# Patient Record
Sex: Male | Born: 1975
Health system: Southern US, Community
[De-identification: ages and names within clinical notes are randomized; demographics above are authoritative.]

## PROBLEM LIST (undated history)

## (undated) DIAGNOSIS — M109 Gout, unspecified: Secondary | ICD-10-CM

## (undated) DIAGNOSIS — R7612 Nonspecific reaction to cell mediated immunity measurement of gamma interferon antigen response without active tuberculosis: Secondary | ICD-10-CM

## (undated) DIAGNOSIS — K430 Incisional hernia with obstruction, without gangrene: Secondary | ICD-10-CM

## (undated) DIAGNOSIS — M7710 Lateral epicondylitis, unspecified elbow: Secondary | ICD-10-CM

## (undated) DIAGNOSIS — E119 Type 2 diabetes mellitus without complications: Secondary | ICD-10-CM

## (undated) DIAGNOSIS — R51 Headache: Secondary | ICD-10-CM

## (undated) DIAGNOSIS — K219 Gastro-esophageal reflux disease without esophagitis: Secondary | ICD-10-CM

## (undated) DIAGNOSIS — M25519 Pain in unspecified shoulder: Secondary | ICD-10-CM

## (undated) HISTORY — PX: TONSILLECTOMY: SUR1361

## (undated) HISTORY — DX: Nonspecific reaction to cell mediated immunity measurement of gamma interferon antigen response without active tuberculosis: R76.12

## (undated) HISTORY — DX: Pain in unspecified shoulder: M25.519

## (undated) HISTORY — DX: Headache: R51

## (undated) HISTORY — DX: Gastro-esophageal reflux disease without esophagitis: K21.9

## (undated) HISTORY — DX: Gout, unspecified: M10.9

## (undated) HISTORY — DX: Type 2 diabetes mellitus without complications: E11.9

## (undated) HISTORY — DX: Lateral epicondylitis, unspecified elbow: M77.10

---
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
## 1998-03-09 ENCOUNTER — Other Ambulatory Visit: Admission: RE | Admit: 1998-03-09 | Discharge: 1998-03-09 | Payer: Self-pay | Admitting: Urology

## 1998-05-11 ENCOUNTER — Encounter: Payer: Self-pay | Admitting: *Deleted

## 1998-05-11 ENCOUNTER — Ambulatory Visit (HOSPITAL_COMMUNITY): Admission: RE | Admit: 1998-05-11 | Discharge: 1998-05-11 | Payer: Self-pay | Admitting: Family Medicine

## 2001-06-21 ENCOUNTER — Ambulatory Visit (HOSPITAL_COMMUNITY): Admission: RE | Admit: 2001-06-21 | Discharge: 2001-06-21 | Payer: Self-pay | Admitting: Family Medicine

## 2001-06-21 ENCOUNTER — Encounter: Payer: Self-pay | Admitting: Family Medicine

## 2001-11-23 ENCOUNTER — Ambulatory Visit (HOSPITAL_COMMUNITY): Admission: RE | Admit: 2001-11-23 | Discharge: 2001-11-23 | Payer: Self-pay | Admitting: Family Medicine

## 2001-11-23 ENCOUNTER — Encounter: Payer: Self-pay | Admitting: Family Medicine

## 2004-06-07 ENCOUNTER — Emergency Department (HOSPITAL_COMMUNITY): Admission: EM | Admit: 2004-06-07 | Discharge: 2004-06-07 | Payer: Self-pay | Admitting: Emergency Medicine

## 2005-08-11 ENCOUNTER — Ambulatory Visit (HOSPITAL_COMMUNITY): Admission: RE | Admit: 2005-08-11 | Discharge: 2005-08-11 | Payer: Self-pay | Admitting: Family Medicine

## 2009-01-09 ENCOUNTER — Encounter: Payer: Self-pay | Admitting: Family Medicine

## 2009-01-10 ENCOUNTER — Ambulatory Visit: Payer: Self-pay | Admitting: Family Medicine

## 2009-01-10 DIAGNOSIS — R519 Headache, unspecified: Secondary | ICD-10-CM | POA: Insufficient documentation

## 2009-01-10 DIAGNOSIS — R51 Headache: Secondary | ICD-10-CM | POA: Insufficient documentation

## 2009-01-10 DIAGNOSIS — R079 Chest pain, unspecified: Secondary | ICD-10-CM | POA: Insufficient documentation

## 2009-01-10 HISTORY — DX: Headache: R51

## 2009-01-11 LAB — CONVERTED CEMR LAB
ALT: 31 units/L (ref 0–53)
AST: 22 units/L (ref 0–37)
Albumin: 4 g/dL (ref 3.5–5.2)
Alkaline Phosphatase: 77 units/L (ref 39–117)
BUN: 14 mg/dL (ref 6–23)
Basophils Absolute: 0.1 10*3/uL (ref 0.0–0.1)
Basophils Relative: 0.7 % (ref 0.0–3.0)
Bilirubin, Direct: 0 mg/dL (ref 0.0–0.3)
CO2: 29 meq/L (ref 19–32)
Calcium: 9.3 mg/dL (ref 8.4–10.5)
Chloride: 108 meq/L (ref 96–112)
Cholesterol: 222 mg/dL — ABNORMAL HIGH (ref 0–200)
Creatinine, Ser: 1 mg/dL (ref 0.4–1.5)
Direct LDL: 160.2 mg/dL
Eosinophils Absolute: 0.2 10*3/uL (ref 0.0–0.7)
Eosinophils Relative: 2.2 % (ref 0.0–5.0)
GFR calc non Af Amer: 91.12 mL/min (ref >60)
Glucose, Bld: 105 mg/dL — ABNORMAL HIGH (ref 70–99)
HCT: 46.8 % (ref 39.0–52.0)
HDL: 38.2 mg/dL — ABNORMAL LOW (ref >39.00)
Hemoglobin: 16.3 g/dL (ref 13.0–17.0)
Lymphocytes Relative: 20.4 % (ref 12.0–46.0)
Lymphs Abs: 1.6 10*3/uL (ref 0.7–4.0)
MCHC: 34.8 g/dL (ref 30.0–36.0)
MCV: 90.2 fL (ref 78.0–100.0)
Monocytes Absolute: 0.5 10*3/uL (ref 0.1–1.0)
Monocytes Relative: 5.7 % (ref 3.0–12.0)
Neutro Abs: 5.5 10*3/uL (ref 1.4–7.7)
Neutrophils Relative %: 71 % (ref 43.0–77.0)
Platelets: 192 10*3/uL (ref 150.0–400.0)
Potassium: 4.4 meq/L (ref 3.5–5.1)
RBC: 5.18 M/uL (ref 4.22–5.81)
RDW: 13 % (ref 11.5–14.6)
Sodium: 142 meq/L (ref 135–145)
TSH: 2.03 microintl units/mL (ref 0.35–5.50)
Total Bilirubin: 1 mg/dL (ref 0.3–1.2)
Total CHOL/HDL Ratio: 6
Total Protein: 6.7 g/dL (ref 6.0–8.3)
Triglycerides: 165 mg/dL — ABNORMAL HIGH (ref 0.0–149.0)
VLDL: 33 mg/dL (ref 0.0–40.0)
WBC: 7.9 10*3/uL (ref 4.5–10.5)

## 2009-02-19 ENCOUNTER — Encounter (INDEPENDENT_AMBULATORY_CARE_PROVIDER_SITE_OTHER): Payer: Self-pay | Admitting: *Deleted

## 2009-08-03 ENCOUNTER — Ambulatory Visit: Payer: Self-pay | Admitting: Family Medicine

## 2009-08-03 DIAGNOSIS — M7710 Lateral epicondylitis, unspecified elbow: Secondary | ICD-10-CM | POA: Insufficient documentation

## 2009-08-03 DIAGNOSIS — B9789 Other viral agents as the cause of diseases classified elsewhere: Secondary | ICD-10-CM | POA: Insufficient documentation

## 2009-08-03 HISTORY — DX: Lateral epicondylitis, unspecified elbow: M77.10

## 2009-12-25 ENCOUNTER — Emergency Department (HOSPITAL_COMMUNITY): Admission: EM | Admit: 2009-12-25 | Discharge: 2009-12-25 | Payer: Self-pay | Admitting: Emergency Medicine

## 2010-01-23 ENCOUNTER — Ambulatory Visit: Payer: Self-pay | Admitting: Family Medicine

## 2010-01-23 DIAGNOSIS — M25519 Pain in unspecified shoulder: Secondary | ICD-10-CM | POA: Insufficient documentation

## 2010-01-23 HISTORY — DX: Pain in unspecified shoulder: M25.519

## 2010-01-24 ENCOUNTER — Encounter (INDEPENDENT_AMBULATORY_CARE_PROVIDER_SITE_OTHER): Payer: Self-pay | Admitting: *Deleted

## 2010-03-21 DIAGNOSIS — K2990 Gastroduodenitis, unspecified, without bleeding: Secondary | ICD-10-CM

## 2010-03-21 DIAGNOSIS — K2970 Gastritis, unspecified, without bleeding: Secondary | ICD-10-CM | POA: Insufficient documentation

## 2010-03-21 DIAGNOSIS — K219 Gastro-esophageal reflux disease without esophagitis: Secondary | ICD-10-CM | POA: Insufficient documentation

## 2010-03-21 HISTORY — DX: Gastro-esophageal reflux disease without esophagitis: K21.9

## 2010-07-23 NOTE — Letter (Signed)
Summary: New Patient letter  [facility]  [address] [place], [place] [address]   Phone: [phone]  Fax: [phone]       01/24/2010 MRN: [id number]  Jonathan Vaughn [address] [place], [place]  [address]  Dear Jonathan Vaughn,  Welcome to the Gastroenterology Division at [facility].    You are scheduled to see Dr. Juanda Chance on 03/29/2010 at 10:30AM on the 3rd floor at [facility], [address]  We ask that you try to arrive at our office 15 minutes prior to your appointment time to allow for check-in.  We would like you to complete the enclosed self-administered evaluation form prior to your visit and bring it with you on the day of your appointment.  We will review it with you.  Also, please bring a complete list of all your medications or, if you prefer, bring the medication bottles and we will list them.  Please bring your insurance card so that we may make a copy of it.  If your insurance requires a referral to see a specialist, please bring your referral form from your primary care physician.  Co-payments are due at the time of your visit and may be paid by cash, check or credit card.     Your office visit will consist of a consult with your physician (includes a physical exam), any laboratory testing he/she may order, scheduling of any necessary diagnostic testing (e.g. x-ray, ultrasound, CT-scan), and scheduling of a procedure (e.g. Endoscopy, Colonoscopy) if required.  Please allow enough time on your schedule to allow for any/all of these possibilities.    If you cannot keep your appointment, please call [phone] to cancel or reschedule prior to your appointment date.  This allows [place] the opportunity to schedule an appointment for another patient in need of care.  If you do not cancel or reschedule by 5 p.m. the business day prior to your appointment date, you will be charged a $50.00 late cancellation/no-show fee.    Thank you for choosing  [facility] for your medical needs.  We appreciate the opportunity to care for you.  Please visit [place] at our website  to learn more about our practice.                     Sincerely,                                                             The Gastroenterology Division
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
## 2010-07-23 NOTE — Assessment & Plan Note (Signed)
Summary: elbow pain//ccm   Vital Signs:  Patient profile:   35 year old male Temp:     98.7 degrees F oral BP sitting:   110 / 80  (left arm) Cuff size:   regular  Vitals Entered By: Sid Falcon LPN (August 03, 2009 8:35 AM) CC: right elbow pain X 6 months   History of Present Illness: Acute visit. Patient seen right elbow pain almost 6 months. Works as a Music therapist. Increasing pain with gripping.  Achy quality.  Pain is right lateral elbow. No significant radiation. Exacerbated by gripping. No weakness. No injury. over-the-counter medications have not relieved. Has not tried any icing.  Pt has seperate issue.   Complaining of a one-day history of possible low-grade fever, body aches, and nasal congestion. Denies any cough or sore throat.  Allergies: 1)  Zithromax Z-Pak (Azithromycin)  Past History:  Past Medical History: Last updated: 02/14/2009 Current Problems:  CHEST PAIN UNSPECIFIED (ICD-786.50) ROUTINE GENERAL MEDICAL EXAM@HEALTH  CARE FACL (ICD-V70.0) HEADACHE (ICD-784.0) Headache Blood in stool PMH reviewed for relevance  Review of Systems      See HPI  Physical Exam  General:  Well-developed,well-nourished,in no acute distress; alert,appropriate and cooperative throughout examination Eyes:  No corneal or conjunctival inflammation noted. EOMI. Perrla. Funduscopic exam benign, without hemorrhages, exudates or papilledema. Vision grossly normal. Ears:  External ear exam shows no significant lesions or deformities.  Otoscopic examination reveals clear canals, tympanic membranes are intact bilaterally without bulging, retraction, inflammation or discharge. Hearing is grossly normal bilaterally. Mouth:  Oral mucosa and oropharynx without lesions or exudates.  Teeth in good repair. Neck:  No deformities, masses, or tenderness noted. Lungs:  Normal respiratory effort, chest expands symmetrically. Lungs are clear to auscultation, no crackles or wheezes. Heart:  Normal  rate and regular rhythm. S1 and S2 normal without gallop, murmur, click, rub or other extra sounds. Extremities:  Tender right lateral epicondylar region. Pain with extension of wrist against resistance. Good range of motion elbow.   Impression & Recommendations:  Problem # 1:  LATERAL EPICONDYLITIS OF ELBOW (ICD-726.32)  given duration of symptoms recommended consideration of corticosteroid injection. Reviewed risk and benefits and pt consented to right elbow injection with 40 mg Depo-Medrol and 1 cc plain Xylocaine. Consider tennis elbow strap. Recommend icing several times daily  Orders: Injection, Tendon / Ligament ([address]) Depo- Medrol 40mg  (J1030)  Problem # 2:  VIRAL INFECTION (ICD-079.99) treat symptomatically  Complete Medication List: 1)  Chantix Starting Month Pak 0.5 Mg X 11 & 1 Mg X 42 Tabs (Varenicline tartrate) .... Use as directed 2)  Chantix Continuing Month Pak 1 Mg Tabs (Varenicline tartrate) .... Use as directed  Patient Instructions: 1)  Consider tennis elbow strap 2)  Consider icing right elbow 2-3 times daily for 20-30 minutes 3)  Avoid heavy gripping with the right upper extremity is much as possible

## 2010-12-27 ENCOUNTER — Emergency Department (HOSPITAL_COMMUNITY): Payer: Self-pay

## 2010-12-27 ENCOUNTER — Emergency Department (HOSPITAL_COMMUNITY)
Admission: EM | Admit: 2010-12-27 | Discharge: 2010-12-27 | Disposition: A | Discharge status: Home / Self Care | Payer: Self-pay | Attending: Emergency Medicine | Admitting: Emergency Medicine

## 2010-12-27 DIAGNOSIS — M549 Dorsalgia, unspecified: Secondary | ICD-10-CM | POA: Insufficient documentation

## 2010-12-27 DIAGNOSIS — R112 Nausea with vomiting, unspecified: Secondary | ICD-10-CM | POA: Insufficient documentation

## 2010-12-27 DIAGNOSIS — R109 Unspecified abdominal pain: Secondary | ICD-10-CM | POA: Insufficient documentation

## 2010-12-27 LAB — BASIC METABOLIC PANEL
BUN: 16 mg/dL (ref 6–23)
Chloride: 103 mEq/L (ref 96–112)
Creatinine, Ser: 1.09 mg/dL (ref 0.50–1.35)
GFR calc Af Amer: >60 mL/min (ref >60)
GFR calc non Af Amer: >60 mL/min (ref >60)

## 2010-12-27 LAB — CBC
HCT: 45.1 % (ref 39.0–52.0)
MCH: 31.1 pg (ref 26.0–34.0)
MCV: 89.3 fL (ref 78.0–100.0)
RDW: 13.2 % (ref 11.5–15.5)
WBC: 10 10*3/uL (ref 4.0–10.5)

## 2010-12-27 LAB — DIFFERENTIAL
Eosinophils Relative: 2 % (ref 0–5)
Lymphocytes Relative: 18 % (ref 12–46)
Lymphs Abs: 1.8 10*3/uL (ref 0.7–4.0)
Monocytes Relative: 4 % (ref 3–12)

## 2010-12-27 LAB — URINALYSIS, ROUTINE W REFLEX MICROSCOPIC
Bilirubin Urine: NEGATIVE
Glucose, UA: NEGATIVE mg/dL
Hgb urine dipstick: NEGATIVE
Ketones, ur: NEGATIVE mg/dL
Leukocytes, UA: NEGATIVE
Protein, ur: NEGATIVE mg/dL
pH: 6 (ref 5.0–8.0)

## 2011-09-03 ENCOUNTER — Ambulatory Visit: Payer: Self-pay | Admitting: Family Medicine

## 2011-09-04 ENCOUNTER — Ambulatory Visit: Payer: Self-pay | Admitting: Family Medicine

## 2011-09-05 ENCOUNTER — Encounter: Payer: Self-pay | Admitting: Family Medicine

## 2011-09-05 DIAGNOSIS — Z0289 Encounter for other administrative examinations: Secondary | ICD-10-CM

## 2011-09-07 NOTE — Progress Notes (Signed)
This encounter was created in error - please disregard.

## 2012-01-14 ENCOUNTER — Encounter (HOSPITAL_COMMUNITY): Payer: Self-pay | Admitting: *Deleted

## 2012-01-14 ENCOUNTER — Emergency Department (HOSPITAL_COMMUNITY): Payer: BC Managed Care – PPO

## 2012-01-14 ENCOUNTER — Emergency Department (HOSPITAL_COMMUNITY)
Admission: EM | Admit: 2012-01-14 | Discharge: 2012-01-14 | Disposition: A | Discharge status: Home / Self Care | Payer: BC Managed Care – PPO | Attending: Emergency Medicine | Admitting: Emergency Medicine

## 2012-01-14 DIAGNOSIS — F17200 Nicotine dependence, unspecified, uncomplicated: Secondary | ICD-10-CM | POA: Insufficient documentation

## 2012-01-14 DIAGNOSIS — R079 Chest pain, unspecified: Secondary | ICD-10-CM | POA: Insufficient documentation

## 2012-01-14 DIAGNOSIS — K219 Gastro-esophageal reflux disease without esophagitis: Secondary | ICD-10-CM | POA: Insufficient documentation

## 2012-01-14 LAB — BASIC METABOLIC PANEL
Calcium: 10.1 mg/dL (ref 8.4–10.5)
Chloride: 102 mEq/L (ref 96–112)
Creatinine, Ser: 0.98 mg/dL (ref 0.50–1.35)
GFR calc Af Amer: >90 mL/min (ref >90)
GFR calc non Af Amer: >90 mL/min (ref >90)

## 2012-01-14 LAB — CBC WITH DIFFERENTIAL/PLATELET
Basophils Absolute: 0 10*3/uL (ref 0.0–0.1)
Basophils Relative: 0 % (ref 0–1)
HCT: 46.5 % (ref 39.0–52.0)
MCHC: 35.3 g/dL (ref 30.0–36.0)
Monocytes Absolute: 0.5 10*3/uL (ref 0.1–1.0)
Neutro Abs: 10.4 10*3/uL — ABNORMAL HIGH (ref 1.7–7.7)
RDW: 13.3 % (ref 11.5–15.5)

## 2012-01-14 LAB — TROPONIN I: Troponin I: <0.3 ng/mL (ref <0.30)

## 2012-01-14 MED ORDER — SODIUM CHLORIDE 0.9 % IV SOLN
Freq: Once | INTRAVENOUS | Status: AC
  Administered 2012-01-14: 999 mL via INTRAVENOUS

## 2012-01-14 MED ORDER — FAMOTIDINE IN NACL 20-0.9 MG/50ML-% IV SOLN
20.0000 mg | Freq: Once | INTRAVENOUS | Status: AC
  Administered 2012-01-14: 20 mg via INTRAVENOUS
  Filled 2012-01-14: qty 50

## 2012-01-14 NOTE — ED Notes (Signed)
Pt complains of substernal chest pain relieved with spliniting onset 3 hours ago, hx of similar episode 1 week ago that subsided, pt does have recent stress at work, he is self employed.

## 2012-01-14 NOTE — ED Provider Notes (Signed)
Medical screening examination/treatment/procedure(s) were performed by non-physician practitioner and as supervising physician I was immediately available for consultation/collaboration.   Aubreyanna Dorrough B. Bernette Mayers, MD 01/14/12 1526
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
## 2012-01-14 NOTE — ED Notes (Signed)
Went to assess pt and pt sts he is on the phone for now.

## 2012-01-14 NOTE — ED Notes (Signed)
To ED for eval of midsternal cp with radiation to left arm. Pt emotional. Denies vomiting. States he has felt this before but not evaluated.

## 2013-12-19 ENCOUNTER — Encounter (HOSPITAL_COMMUNITY): Payer: Self-pay | Admitting: Emergency Medicine

## 2013-12-19 ENCOUNTER — Emergency Department (HOSPITAL_COMMUNITY)
Admission: EM | Admit: 2013-12-19 | Discharge: 2013-12-19 | Disposition: A | Discharge status: Home / Self Care | Payer: BC Managed Care – PPO | Attending: Emergency Medicine | Admitting: Emergency Medicine

## 2013-12-19 ENCOUNTER — Emergency Department (HOSPITAL_COMMUNITY): Payer: BC Managed Care – PPO

## 2013-12-19 DIAGNOSIS — F17200 Nicotine dependence, unspecified, uncomplicated: Secondary | ICD-10-CM | POA: Insufficient documentation

## 2013-12-19 DIAGNOSIS — Z8719 Personal history of other diseases of the digestive system: Secondary | ICD-10-CM | POA: Insufficient documentation

## 2013-12-19 DIAGNOSIS — Z8619 Personal history of other infectious and parasitic diseases: Secondary | ICD-10-CM | POA: Insufficient documentation

## 2013-12-19 DIAGNOSIS — Z8739 Personal history of other diseases of the musculoskeletal system and connective tissue: Secondary | ICD-10-CM | POA: Insufficient documentation

## 2013-12-19 DIAGNOSIS — R079 Chest pain, unspecified: Secondary | ICD-10-CM

## 2013-12-19 LAB — BASIC METABOLIC PANEL
BUN: 13 mg/dL (ref 6–23)
CHLORIDE: 99 meq/L (ref 96–112)
CO2: 24 mEq/L (ref 19–32)
Calcium: 9.3 mg/dL (ref 8.4–10.5)
Creatinine, Ser: 0.95 mg/dL (ref 0.50–1.35)
Glucose, Bld: 91 mg/dL (ref 70–99)
POTASSIUM: 4.1 meq/L (ref 3.7–5.3)
SODIUM: 141 meq/L (ref 137–147)

## 2013-12-19 LAB — CBC
HEMATOCRIT: 47.1 % (ref 39.0–52.0)
HEMOGLOBIN: 16.2 g/dL (ref 13.0–17.0)
MCH: 31.3 pg (ref 26.0–34.0)
MCHC: 34.4 g/dL (ref 30.0–36.0)
MCV: 91.1 fL (ref 78.0–100.0)
Platelets: 193 10*3/uL (ref 150–400)
RBC: 5.17 MIL/uL (ref 4.22–5.81)
RDW: 13.7 % (ref 11.5–15.5)
WBC: 13.1 10*3/uL — AB (ref 4.0–10.5)

## 2013-12-19 LAB — D-DIMER, QUANTITATIVE (NOT AT ARMC)

## 2013-12-19 LAB — I-STAT TROPONIN, ED
TROPONIN I, POC: 0.01 ng/mL (ref 0.00–0.08)
TROPONIN I, POC: 0.02 ng/mL (ref 0.00–0.08)

## 2013-12-19 MED ORDER — ACETAMINOPHEN 325 MG PO TABS
650.0000 mg | ORAL_TABLET | Freq: Once | ORAL | Status: AC
  Administered 2013-12-19: 650 mg via ORAL
  Filled 2013-12-19: qty 2

## 2013-12-19 MED ORDER — NITROGLYCERIN 0.4 MG SL SUBL: 0.4000 mg | SUBLINGUAL_TABLET | SUBLINGUAL | Status: DC | PRN
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
## 2013-12-19 NOTE — ED Notes (Signed)
[facility] for complaint of chest pain starting at approximately 0900 this morning.  Patient was given 324 mg aspirin at the Urgent Care.  EMS gave 1 sublingual nitro.  Patient initially rating pain at an 8-9.  Now rates at a 2 after nitro.

## 2013-12-20 NOTE — ED Provider Notes (Signed)
I saw and evaluated the patient, reviewed the resident's note and I agree with the findings and plan.   EKG Interpretation   Date/Time:  Monday December 19 2013 11:18:39 EDT Ventricular Rate:  99 PR Interval:  134 QRS Duration: 105 QT Interval:  332 QTC Calculation: 426 R Axis:   6 Text Interpretation:  Sinus rhythm No significant change since last  tracing Confirmed by STEINL  MD, Caryn BeeKEVIN ([id number]) on 12/19/2013 11:22:21 AM      Pt with chest tightness since morning. Constant. At rest. No nv, diaphoresis or sob. No palpitations. No syncope. Denies any exertional cp or discomfort. No unusual doe or fatigue.  Will check troponin x 2, chest xr.   Suzi RootsKevin E Steinl, MD 12/20/13 [phone]

## 2016-06-30 DIAGNOSIS — M545 Low back pain: Secondary | ICD-10-CM | POA: Diagnosis not present

## 2016-06-30 DIAGNOSIS — R351 Nocturia: Secondary | ICD-10-CM | POA: Diagnosis not present

## 2016-08-21 DIAGNOSIS — M109 Gout, unspecified: Secondary | ICD-10-CM | POA: Diagnosis not present

## 2016-09-02 ENCOUNTER — Observation Stay (HOSPITAL_COMMUNITY)
Admission: EM | Admit: 2016-09-02 | Discharge: 2016-09-04 | Disposition: A | Discharge status: Home / Self Care | Payer: BLUE CROSS/BLUE SHIELD | Attending: General Surgery | Admitting: General Surgery

## 2016-09-02 ENCOUNTER — Encounter (HOSPITAL_COMMUNITY): Payer: Self-pay

## 2016-09-02 ENCOUNTER — Observation Stay (HOSPITAL_COMMUNITY): Payer: BLUE CROSS/BLUE SHIELD

## 2016-09-02 ENCOUNTER — Emergency Department (HOSPITAL_COMMUNITY): Payer: BLUE CROSS/BLUE SHIELD

## 2016-09-02 DIAGNOSIS — Z8379 Family history of other diseases of the digestive system: Secondary | ICD-10-CM | POA: Diagnosis not present

## 2016-09-02 DIAGNOSIS — Z881 Allergy status to other antibiotic agents status: Secondary | ICD-10-CM | POA: Insufficient documentation

## 2016-09-02 DIAGNOSIS — R079 Chest pain, unspecified: Secondary | ICD-10-CM | POA: Diagnosis not present

## 2016-09-02 DIAGNOSIS — K2970 Gastritis, unspecified, without bleeding: Secondary | ICD-10-CM | POA: Insufficient documentation

## 2016-09-02 DIAGNOSIS — R1011 Right upper quadrant pain: Secondary | ICD-10-CM

## 2016-09-02 DIAGNOSIS — K8020 Calculus of gallbladder without cholecystitis without obstruction: Secondary | ICD-10-CM

## 2016-09-02 DIAGNOSIS — K219 Gastro-esophageal reflux disease without esophagitis: Secondary | ICD-10-CM | POA: Insufficient documentation

## 2016-09-02 DIAGNOSIS — F17210 Nicotine dependence, cigarettes, uncomplicated: Secondary | ICD-10-CM | POA: Insufficient documentation

## 2016-09-02 DIAGNOSIS — F419 Anxiety disorder, unspecified: Secondary | ICD-10-CM | POA: Insufficient documentation

## 2016-09-02 DIAGNOSIS — K8010 Calculus of gallbladder with chronic cholecystitis without obstruction: Principal | ICD-10-CM | POA: Insufficient documentation

## 2016-09-02 DIAGNOSIS — D72829 Elevated white blood cell count, unspecified: Secondary | ICD-10-CM | POA: Diagnosis not present

## 2016-09-02 DIAGNOSIS — R109 Unspecified abdominal pain: Secondary | ICD-10-CM

## 2016-09-02 DIAGNOSIS — K8000 Calculus of gallbladder with acute cholecystitis without obstruction: Secondary | ICD-10-CM | POA: Diagnosis present

## 2016-09-02 DIAGNOSIS — M1990 Unspecified osteoarthritis, unspecified site: Secondary | ICD-10-CM | POA: Diagnosis not present

## 2016-09-02 DIAGNOSIS — K760 Fatty (change of) liver, not elsewhere classified: Secondary | ICD-10-CM | POA: Diagnosis not present

## 2016-09-02 LAB — CREATININE, SERUM
Creatinine, Ser: 0.76 mg/dL (ref 0.61–1.24)
GFR calc Af Amer: >60 mL/min (ref >60)

## 2016-09-02 LAB — COMPREHENSIVE METABOLIC PANEL
ALT: 45 U/L (ref 17–63)
AST: 24 U/L (ref 15–41)
Albumin: 4.3 g/dL (ref 3.5–5.0)
Alkaline Phosphatase: 78 U/L (ref 38–126)
Anion gap: 13 (ref 5–15)
BILIRUBIN TOTAL: 0.4 mg/dL (ref 0.3–1.2)
BUN: 12 mg/dL (ref 6–20)
CO2: 21 mmol/L — ABNORMAL LOW (ref 22–32)
CREATININE: 0.99 mg/dL (ref 0.61–1.24)
Calcium: 9.6 mg/dL (ref 8.9–10.3)
Chloride: 105 mmol/L (ref 101–111)
Glucose, Bld: 135 mg/dL — ABNORMAL HIGH (ref 65–99)
Potassium: 4.2 mmol/L (ref 3.5–5.1)
Sodium: 139 mmol/L (ref 135–145)
Total Protein: 6.6 g/dL (ref 6.5–8.1)

## 2016-09-02 LAB — CBC
HEMATOCRIT: 45 % (ref 39.0–52.0)
HEMATOCRIT: 45.2 % (ref 39.0–52.0)
HEMOGLOBIN: 15.5 g/dL (ref 13.0–17.0)
HEMOGLOBIN: 15.3 g/dL (ref 13.0–17.0)
MCH: 30.6 pg (ref 26.0–34.0)
MCH: 30.2 pg (ref 26.0–34.0)
MCHC: 34.4 g/dL (ref 30.0–36.0)
MCHC: 33.8 g/dL (ref 30.0–36.0)
MCV: 88.9 fL (ref 78.0–100.0)
MCV: 89.3 fL (ref 78.0–100.0)
Platelets: 237 10*3/uL (ref 150–400)
Platelets: 250 10*3/uL (ref 150–400)
RBC: 5.06 MIL/uL (ref 4.22–5.81)
RBC: 5.06 MIL/uL (ref 4.22–5.81)
RDW: 13.5 % (ref 11.5–15.5)
RDW: 13.6 % (ref 11.5–15.5)
WBC: 17.7 10*3/uL — ABNORMAL HIGH (ref 4.0–10.5)
WBC: 16 10*3/uL — ABNORMAL HIGH (ref 4.0–10.5)

## 2016-09-02 LAB — I-STAT TROPONIN, ED: TROPONIN I, POC: 0 ng/mL (ref 0.00–0.08)

## 2016-09-02 LAB — LIPASE, BLOOD

## 2016-09-02 MED ORDER — PANTOPRAZOLE SODIUM 40 MG IV SOLR
40.0000 mg | Freq: Two times a day (BID) | INTRAVENOUS | Status: DC
  Administered 2016-09-02 – 2016-09-03 (×3): 40 mg via INTRAVENOUS
  Filled 2016-09-02 (×3): qty 40

## 2016-09-02 MED ORDER — ONDANSETRON HCL 4 MG/2ML IJ SOLN
4.0000 mg | Freq: Four times a day (QID) | INTRAMUSCULAR | Status: DC | PRN
Start: 2016-09-02 — End: 2016-09-04
  Administered 2016-09-02 – 2016-09-03 (×2): 4 mg via INTRAVENOUS
  Filled 2016-09-02: qty 2

## 2016-09-02 MED ORDER — SODIUM CHLORIDE 0.9 % IV BOLUS (SEPSIS)
1000.0000 mL | Freq: Once | INTRAVENOUS | Status: AC
  Administered 2016-09-02: 1000 mL via INTRAVENOUS

## 2016-09-02 MED ORDER — DEXTROSE 5 % IV SOLN
2.0000 g | INTRAVENOUS | Status: DC
  Administered 2016-09-02 – 2016-09-03 (×2): 2 g via INTRAVENOUS
  Filled 2016-09-02 (×3): qty 2

## 2016-09-02 MED ORDER — HYDROMORPHONE HCL 2 MG/ML IJ SOLN
1.0000 mg | Freq: Once | INTRAMUSCULAR | Status: AC
  Administered 2016-09-02: 1 mg via INTRAVENOUS
  Filled 2016-09-02: qty 1

## 2016-09-02 MED ORDER — HEPARIN SODIUM (PORCINE) 5000 UNIT/ML IJ SOLN: 5000.0000 [IU] | Freq: Three times a day (TID) | INTRAMUSCULAR | Status: DC

## 2016-09-02 MED ORDER — ONDANSETRON HCL 4 MG/2ML IJ SOLN
4.0000 mg | Freq: Once | INTRAMUSCULAR | Status: AC
  Administered 2016-09-02: 4 mg via INTRAVENOUS
  Filled 2016-09-02: qty 2

## 2016-09-02 MED ORDER — IOPAMIDOL (ISOVUE-300) INJECTION 61%
INTRAVENOUS | Status: AC
Start: 2016-09-02 — End: 2016-09-02
  Administered 2016-09-02: 100 mL
  Filled 2016-09-02: qty 100

## 2016-09-02 MED ORDER — GI COCKTAIL ~~LOC~~
30.0000 mL | Freq: Once | ORAL | Status: AC
  Administered 2016-09-02: 30 mL via ORAL
  Filled 2016-09-02: qty 30

## 2016-09-02 MED ORDER — ONDANSETRON 4 MG PO TBDP: 4.0000 mg | ORAL_TABLET | Freq: Four times a day (QID) | ORAL | Status: DC | PRN

## 2016-09-02 MED ORDER — POTASSIUM CHLORIDE IN NACL 20-0.9 MEQ/L-% IV SOLN
INTRAVENOUS | Status: DC
  Administered 2016-09-02 – 2016-09-03 (×3): via INTRAVENOUS
  Filled 2016-09-02 (×3): qty 1000

## 2016-09-02 MED ORDER — HYDROMORPHONE HCL 2 MG/ML IJ SOLN
0.5000 mg | Freq: Once | INTRAMUSCULAR | Status: AC
  Administered 2016-09-02: 0.5 mg via INTRAVENOUS
  Filled 2016-09-02: qty 1

## 2016-09-02 MED ORDER — MORPHINE SULFATE (PF) 4 MG/ML IV SOLN
2.0000 mg | INTRAVENOUS | Status: DC | PRN
  Administered 2016-09-02 – 2016-09-04 (×7): 4 mg via INTRAVENOUS
  Filled 2016-09-02 (×8): qty 1

## 2016-09-02 NOTE — ED Notes (Signed)
ED Provider at bedside. 

## 2016-09-02 NOTE — ED Notes (Signed)
Patient transported to Ultrasound 

## 2016-09-02 NOTE — ED Provider Notes (Signed)
[facility]-EMERGENCY DEPT Provider Note   CSN: [id number] Arrival date & time: 09/02/16  1324     History   Chief Complaint Chief Complaint  Patient presents with  . Chest Pain    HPI Jonathan Vaughn is a 41 y.o. male.  Patient is a 41 year old male with a history of GERD, gastritis and occasional headache who has a history of tobacco abuse and alcohol use presenting today with abrupt onset of abdominal pain. Patient states when he went to bed last night he felt fine. Around midnight he woke up with severe right upper quadrant and epigastric discomfort that radiated into the chest. It has persisted only worsened throughout the night and is still not gone. He has tried to take Tums and over-the-counter indigestion medication without improvement. He has had several episodes of nonbloody vomiting and continues to be nauseated. He denies true shortness of breath but states taking a deep breath makes the pain worse. He has not had any bowel movements since the pain started but has been able to urinate fine. He denies any cough or upper respiratory symptoms. He has never had a pain like this before.   The history is provided by the patient.  Chest Pain   The pain is at a severity of 9/10. The pain is severe. The quality of the pain is described as sharp and stabbing. The pain radiates to the epigastrium and precordial region. Associated symptoms include nausea and vomiting. Pertinent negatives include no cough, no fever, no irregular heartbeat, no lower extremity edema, no shortness of breath, no sputum production and no syncope.    Past Medical History:  Diagnosis Date  . GASTRITIS 03/21/2010   Qualifier: Diagnosis of  By: Nelson-Smith CMA (AAMA), Dottie    . GERD 03/21/2010   Qualifier: Diagnosis of  By: Nelson-Smith CMA (AAMA), Dottie    . Headache(784.0) 01/10/2009   Qualifier: Diagnosis of  By: Gabriel Rung LPN, Harriett Sine    . LATERAL EPICONDYLITIS OF ELBOW 08/03/2009   Qualifier: Diagnosis of  By:  Caryl Never MD, Bruce    . SHOULDER PAIN 01/23/2010   Qualifier: Diagnosis of  By: Gabriel Rung LPN, Harriett Sine    . VIRAL INFECTION 08/03/2009   Qualifier: Diagnosis of  By: Caryl Never MD, Bruce      Patient Active Problem List   Diagnosis Date Noted  . GERD 03/21/2010  . GASTRITIS 03/21/2010  . SHOULDER PAIN 01/23/2010  . VIRAL INFECTION 08/03/2009  . LATERAL EPICONDYLITIS OF ELBOW 08/03/2009  . HEADACHE 01/10/2009  . CHEST PAIN UNSPECIFIED 01/10/2009    Past Surgical History:  Procedure Laterality Date  . TONSILLECTOMY         Home Medications    Prior to Admission medications   Not on File    Family History Family History  Problem Relation Age of Onset  . Crohn's disease Other     Social History Social History  Substance Use Topics  . Smoking status: Current Every Day Smoker    Types: Cigarettes  . Smokeless tobacco: Never Used  . Alcohol use 12.0 oz/week    20 Shots of liquor per week     Comment: states goes through a 1/2 gallon a week (vodka and redbull)     Allergies   Azithromycin   Review of Systems Review of Systems  Constitutional: Negative for fever.  Respiratory: Negative for cough, sputum production and shortness of breath.   Cardiovascular: Positive for chest pain. Negative for syncope.  Gastrointestinal: Positive for nausea and vomiting.  All  other systems reviewed and are negative.    Physical Exam Updated Vital Signs BP 135/76 (BP Location: Right Arm)   Pulse (!) 55   Temp 97.8 F (36.6 C) (Oral)   Resp 22   Ht 6\' 1"  (1.854 m)   Wt 258 lb (117 kg)   SpO2 100%   BMI 34.04 kg/m   Physical Exam  Constitutional: He is oriented to person, place, and time. He appears well-developed and well-nourished. No distress.  HENT:  Head: Normocephalic and atraumatic.  Mouth/Throat: Oropharynx is clear and moist.  Eyes: Conjunctivae and EOM are normal. Pupils are equal, round, and reactive to light.  Neck: Normal range of motion. Neck supple.    Cardiovascular: Normal rate, regular rhythm and intact distal pulses.   No murmur heard. Pulmonary/Chest: Effort normal and breath sounds normal. No respiratory distress. He has no wheezes. He has no rales.  Abdominal: Soft. He exhibits no distension. There is tenderness in the right upper quadrant and epigastric area. There is guarding and positive Murphy's sign. There is no rebound. No hernia.  Musculoskeletal: Normal range of motion. He exhibits no edema or tenderness.  Neurological: He is alert and oriented to person, place, and time.  Skin: Skin is warm and dry. No rash noted. No erythema.  Psychiatric: He has a normal mood and affect. His behavior is normal.  Nursing note and vitals reviewed.    ED Treatments / Results  Labs (all labs ordered are listed, but only abnormal results are displayed) Labs Reviewed  CBC - Abnormal; Notable for the following:       Result Value   WBC 17.7 (*)    All other components within normal limits  COMPREHENSIVE METABOLIC PANEL - Abnormal; Notable for the following:    CO2 21 (*)    Glucose, Bld 135 (*)    All other components within normal limits  LIPASE, BLOOD - Abnormal; Notable for the following:    Lipase <10 (*)    All other components within normal limits  I-STAT TROPOININ, ED    EKG  EKG Interpretation  Date/Time:  Tuesday September 02 2016 08:18:53 EDT Ventricular Rate:  55 PR Interval:  134 QRS Duration: 102 QT Interval:  402 QTC Calculation: 384 R Axis:   35 Text Interpretation:  Sinus bradycardia Otherwise normal ECG No significant change since last tracing Confirmed by Anitra LauthPLUNKETT  MD, Alphonzo LemmingsWHITNEY ([id number]) on 09/02/2016 8:38:52 AM       Radiology Dg Chest 2 View  Result Date: 09/02/2016 CLINICAL DATA:  Severe chest/epigastric pain EXAM: CHEST  2 VIEW COMPARISON:  12/19/2013 FINDINGS: Lungs are clear.  No pleural effusion or pneumothorax. The heart is normal in size. Visualized osseous structures are within normal limits.  IMPRESSION: Normal chest radiographs. Electronically Signed   By: Charline BillsSriyesh  Krishnan M.D.   On: 09/02/2016 08:50   [place] Abdomen Limited Ruq  Result Date: 09/02/2016 CLINICAL DATA:  Right upper quadrant pain, some nausea and vomiting over the last 24 hours EXAM: US ABDOMEN LIMITED - RIGHT UPPER QUADRANT COMPARISON:  CT abdomen pelvis of 12/27/2010 FINDINGS: Gallbladder: The gallbladder is visualized and there are gallstones present, the largest of 2.5 cm with acoustical shadowing. No gallbladder wall thickening is seen and there is no pain over the gallbladder with compression. Common bile duct: Diameter: The common bile duct measures 4.8 mm in diameter. Liver: The liver is inhomogeneous suggesting fatty infiltration but no focal hepatic abnormality is seen. IMPRESSION: 1. Gallstones, the largest of 2.5 cm. No present  ultrasound evidence of acute cholecystitis is seen. 2. Inhomogeneous liver parenchyma suggesting fatty infiltration. Correlate with LFTs. Electronically Signed   By: Dwyane Dee M.D.   On: 09/02/2016 10:48    Procedures Procedures (including critical care time)  Medications Ordered in ED Medications  sodium chloride 0.9 % bolus 1,000 mL (1,000 mLs Intravenous New Bag/Given 09/02/16 0900)  HYDROmorphone (DILAUDID) injection 1 mg (1 mg Intravenous Given 09/02/16 0900)  ondansetron (ZOFRAN) injection 4 mg (4 mg Intravenous Given 09/02/16 0900)  gi cocktail (Maalox,Lidocaine,Donnatal) (30 mLs Oral Given 09/02/16 0955)     Initial Impression / Assessment and Plan / ED Course  I have reviewed the triage vital signs and the nursing notes.  Pertinent labs & imaging results that were available during my care of the patient were reviewed by me and considered in my medical decision making (see chart for details).    Patient is a 41 year old male here with mostly right upper quadrant pain concerning for cholelithiasis versus gastritis versus pancreatitis. Low suspicion for cardiac pathology at  this time. Most of his pain is in the epigastrium and upper abdomen. He has had nausea and vomiting. This started approximately at 12:00.  Low suspicion for dissection, ACS or PE.  CBC, CMP, lipase, troponin pending. Patient had a chest x-ray and EKG prior to being that it which were within normal limits.   Labs are significant for a leukocytosis of 17 but normal LFTs and lipase and bilirubin. On repeat evaluation after pain medication he still has significant right upper quadrant pain. Will do an ultrasound to further evaluate  11:38 AM [place] with gallstone and pt still having significant pain.  Will discuss with general surgery  2:19 PM Pt admitted by surgery due to persistent pain.  Final Clinical Impressions(s) / ED Diagnoses   Final diagnoses:  RUQ pain  Calculus of gallbladder without cholecystitis without obstruction    New Prescriptions New Prescriptions   No medications on file     Gwyneth Sprout, MD 09/02/16 1419

## 2016-09-02 NOTE — Consult Note (Deleted)
Reason for Consult:  gallstones Referring Physician:  Purcell Mouton, (ED)  Jonathan Vaughn is an 41 y.o. male.  HPI: Pt comes to ED with mid epigastric pain and squeezing abdominal pain.  It felt like his GERD sx.  Those sx usually last about 1-2 hours.  This lasted all PM, and became progressively worse.  Because of the ongoing pain, nausea, and vomiting he came to the ED.  He also has a hx of gastritis.  He points to RUQ, and thinks chest pain was referred and anxiety.  He has had nausea and vomited x 2 since onset of sx  last PM.  Work up in the ED shows he is afebrile, VSS Bradycardic on admit.labs show a glucose up to 135, otherwise normal.  Lipase <10.  WBC is 17.7, H/G 15/45.  EKG shows sinus Bradycardia, with no significant changes.  Trop neg x 1CXR, is normal.  ABD US:1. Gallstones, the largest of 2.5 cm. No present ultrasound evidence of acute cholecystitis is seen. 2. Inhomogeneous liver parenchyma suggesting fatty infiltration.  We are ask to see.     Past Medical History:  Diagnosis Date  . GASTRITIS 03/21/2010   Qualifier: Diagnosis of  By: Nelson-Smith CMA (AAMA), Dottie    . GERD 03/21/2010   Qualifier: Diagnosis of  By: Nelson-Smith CMA (AAMA), Dottie    . Headache(784.0) 01/10/2009   Qualifier: Diagnosis of  By: Valma Cava LPN, Izora Gala    . LATERAL EPICONDYLITIS OF ELBOW 08/03/2009   Qualifier: Diagnosis of  By: Elease Hashimoto MD, Bruce    . SHOULDER PAIN 01/23/2010   Qualifier: Diagnosis of  By: Valma Cava LPN, Izora Gala    . VIRAL INFECTION 08/03/2009   Qualifier: Diagnosis of  By: Elease Hashimoto MD, Bruce      Past Surgical History:  Procedure Laterality Date  . TONSILLECTOMY      Family History  Problem Relation Age of Onset  . Crohn's disease Other     Social History:  reports that he has been smoking Cigarettes.  He has never used smokeless tobacco. He reports that he drinks about 12.0 oz of alcohol per week . He reports that he does not use drugs.   Tobacco: 2.5 PPD x 25 years ETOH:  1/2 gal q 3 days for many years, down to less than a pint q 2 weeks Drugs:  None  Married/lives with wife   Allergies:  Allergies  Allergen Reactions  . Azithromycin  His mother said he was, he does not know what the reaction was.   Rash   Prior to Admission medications   Not on File     Results for orders placed or performed during the hospital encounter of 09/02/16 (from the past 48 hour(s))  CBC     Status: Abnormal   Collection Time: 09/02/16  8:43 AM  Result Value Ref Range   WBC 17.7 (H) 4.0 - 10.5 K/uL   RBC 5.06 4.22 - 5.81 MIL/uL   Hemoglobin 15.5 13.0 - 17.0 g/dL   HCT 45.0 39.0 - 52.0 %   MCV 88.9 78.0 - 100.0 fL   MCH 30.6 26.0 - 34.0 pg   MCHC 34.4 30.0 - 36.0 g/dL   RDW 13.5 11.5 - 15.5 %   Platelets 237 150 - 400 K/uL  Comprehensive metabolic panel     Status: Abnormal   Collection Time: 09/02/16  8:43 AM  Result Value Ref Range   Sodium 139 135 - 145 mmol/L   Potassium 4.2 3.5 - 5.1 mmol/L  Chloride 105 101 - 111 mmol/L   CO2 21 (L) 22 - 32 mmol/L   Glucose, Bld 135 (H) 65 - 99 mg/dL   BUN 12 6 - 20 mg/dL   Creatinine, Ser 0.99 0.61 - 1.24 mg/dL   Calcium 9.6 8.9 - 10.3 mg/dL   Total Protein 6.6 6.5 - 8.1 g/dL   Albumin 4.3 3.5 - 5.0 g/dL   AST 24 15 - 41 U/L   ALT 45 17 - 63 U/L   Alkaline Phosphatase 78 38 - 126 U/L   Total Bilirubin 0.4 0.3 - 1.2 mg/dL   GFR calc non Af Amer >60 >60 mL/min   GFR calc Af Amer >60 >60 mL/min    Comment: (NOTE) The eGFR has been calculated using the CKD EPI equation. This calculation has not been validated in all clinical situations. eGFR's persistently <60 mL/min signify possible Chronic Kidney Disease.    Anion gap 13 5 - 15  Lipase, blood     Status: Abnormal   Collection Time: 09/02/16  8:43 AM  Result Value Ref Range   Lipase <10 (L) 11 - 51 U/L    Comment: RESULT REPEATED AND VERIFIED  I-stat troponin, ED     Status: None   Collection Time: 09/02/16  8:50 AM  Result Value Ref Range   Troponin i,  poc 0.00 0.00 - 0.08 ng/mL   Comment 3            Comment: Due to the release kinetics of cTnI, a negative result within the first hours of the onset of symptoms does not rule out myocardial infarction with certainty. If myocardial infarction is still suspected, repeat the test at appropriate intervals.     Dg Chest 2 View  Result Date: 09/02/2016 CLINICAL DATA:  Severe chest/epigastric pain EXAM: CHEST  2 VIEW COMPARISON:  12/19/2013 FINDINGS: Lungs are clear.  No pleural effusion or pneumothorax. The heart is normal in size. Visualized osseous structures are within normal limits. IMPRESSION: Normal chest radiographs. Electronically Signed   By: Julian Hy M.D.   On: 09/02/2016 08:50   US Abdomen Limited Ruq  Result Date: 09/02/2016 CLINICAL DATA:  Right upper quadrant pain, some nausea and vomiting over the last 24 hours EXAM: US ABDOMEN LIMITED - RIGHT UPPER QUADRANT COMPARISON:  CT abdomen pelvis of 12/27/2010 FINDINGS: Gallbladder: The gallbladder is visualized and there are gallstones present, the largest of 2.5 cm with acoustical shadowing. No gallbladder wall thickening is seen and there is no pain over the gallbladder with compression. Common bile duct: Diameter: The common bile duct measures 4.8 mm in diameter. Liver: The liver is inhomogeneous suggesting fatty infiltration but no focal hepatic abnormality is seen. IMPRESSION: 1. Gallstones, the largest of 2.5 cm. No present ultrasound evidence of acute cholecystitis is seen. 2. Inhomogeneous liver parenchyma suggesting fatty infiltration. Correlate with LFTs. Electronically Signed   By: Ivar Drape M.D.   On: 09/02/2016 10:48    Review of Systems  Constitutional: Negative for chills, diaphoresis, fever, malaise/fatigue and weight loss.  HENT: Negative.   Eyes: Negative.   Respiratory: Positive for wheezing. Negative for cough, hemoptysis, sputum production and shortness of breath.   Cardiovascular: Positive for chest pain  and palpitations (not recently, but has had this with anxiety). Negative for orthopnea, claudication and leg swelling.  Gastrointestinal: Positive for abdominal pain, heartburn, nausea and vomiting. Negative for blood in stool, constipation, diarrhea and melena.  Genitourinary: Negative.        Not peeing much  today   Musculoskeletal: Negative.   Skin: Negative.   Neurological: Negative.  Negative for weakness.  Endo/Heme/Allergies: Negative.   Psychiatric/Behavioral: Negative.    Blood pressure 144/93, pulse 64, temperature 97.8 F (36.6 C), temperature source Oral, resp. rate 21, height _0  (1.854 m), weight 117 kg (258 lb), SpO2 92 %. Physical Exam  Constitutional: He is oriented to person, place, and time. He appears well-developed and well-nourished.  C/o pain RUQ, and mid epigastric site  HENT:  Head: Normocephalic and atraumatic.  Mouth/Throat: No oropharyngeal exudate.  Eyes: Right eye exhibits no discharge. Left eye exhibits no discharge. No scleral icterus.  Neck: Normal range of motion. Neck supple. No JVD present. No tracheal deviation present. No thyromegaly present.  Cardiovascular: Regular rhythm, normal heart sounds and intact distal pulses.  Exam reveals no gallop and no friction rub.   No murmur heard. bradycardic  Respiratory: Effort normal and breath sounds normal. No respiratory distress. He has no wheezes. He has no rales. He exhibits no tenderness.  GI: Soft. Bowel sounds are normal. He exhibits no distension and no mass. There is tenderness. There is no rebound and no guarding.  Musculoskeletal: He exhibits no edema or tenderness.  Lymphadenopathy:    He has no cervical adenopathy.  Neurological: He is alert and oriented to person, place, and time. No cranial nerve deficit.  Skin: Skin is warm and dry. No rash noted. He is not diaphoretic. No erythema. No pallor.  Psychiatric: He has a normal mood and affect. His behavior is normal. Judgment and thought  content normal.    Assessment/Plan: RUQ pain, nausea and vomiting Cholelithiasis Hx of gastritis Hx of ETOH abuse, decreased over the last year Tobacco use 25 years 2.5 PPD GERD   Plan:  Admit, hydrate, IV rocephin, PPI, HIDA scan.  Clears if he can tolerate it this PM, and NPO after mn.. I want to be sure it's his GB and not a gastric ulcer or gastritis.  Recheck labs in AM also.   Jonathan Vaughn 09/02/2016, 12:56 PM

## 2016-09-02 NOTE — Progress Notes (Signed)
Called ED to get report, RN will call back.

## 2016-09-02 NOTE — ED Notes (Signed)
Patient requesting more pain meds. He is very lethargic and slurring his words when he tries to talk and his sats are at 92%-93% and have dipped into the 80's when he sleeps. Not administering pain medication at this time, it to close to the last admin. Of dilaudid. Explained this to patient and he understands.

## 2016-09-02 NOTE — ED Triage Notes (Signed)
Per Pt, Pt woke up at 1200 last night with complaints of mid-center chest pain and epigastric squeezing pain. Pt reports nausea and vomiting. Complains of SOB.

## 2016-09-02 NOTE — ED Notes (Signed)
Report given.

## 2016-09-02 NOTE — H&P (Signed)
Expand All Collapse All   Hide copied text Reason for Consult:  gallstones Referring Physician:  Purcell Mouton, (ED)   Jonathan Vaughn is an 41 y.o. male.  HPI: Pt comes to ED with mid epigastric pain and squeezing abdominal pain.  It felt like his GERD sx.  Those sx usually last about 1-2 hours.  This lasted all PM, and became progressively worse.  Because of the ongoing pain, nausea, and vomiting he came to the ED.  He also has a hx of gastritis.  He points to RUQ, and thinks chest pain was referred and anxiety.  He has had nausea and vomited x 2 since onset of sx  last PM.   Work up in the ED shows he is afebrile, VSS Bradycardic on admit.labs show a glucose up to 135, otherwise normal.  Lipase <10.  WBC is 17.7, H/G 15/45.  EKG shows sinus Bradycardia, with no significant changes.  Trop neg x 1CXR, is normal.  ABD US:1. Gallstones, the largest of 2.5 cm. No present ultrasound evidence of acute cholecystitis is seen. 2. Inhomogeneous liver parenchyma suggesting fatty infiltration.  We are ask to see.          Past Medical History:  Diagnosis Date  . GASTRITIS 03/21/2010    Qualifier: Diagnosis of  By: Nelson-Smith CMA (AAMA), Dottie    . GERD 03/21/2010    Qualifier: Diagnosis of  By: Nelson-Smith CMA (AAMA), Dottie    . Headache(784.0) 01/10/2009    Qualifier: Diagnosis of  By: Valma Cava LPN, Izora Gala    . LATERAL EPICONDYLITIS OF ELBOW 08/03/2009    Qualifier: Diagnosis of  By: Elease Hashimoto MD, Bruce    . SHOULDER PAIN 01/23/2010    Qualifier: Diagnosis of  By: Valma Cava LPN, Izora Gala    . VIRAL INFECTION 08/03/2009    Qualifier: Diagnosis of  By: Elease Hashimoto MD, Bruce             Past Surgical History:  Procedure Laterality Date  . TONSILLECTOMY               Family History  Problem Relation Age of Onset  . Crohn's disease Other        Social History:  reports that he has been smoking Cigarettes.  He has never used smokeless tobacco. He reports that he drinks about 12.0 oz of alcohol per  week . He reports that he does not use drugs.    Tobacco: 2.5 PPD x 25 years ETOH: 1/2 gal q 3 days for many years, down to less than a pint q 2 weeks Drugs:  None   Married/lives with wife     Allergies:      Allergies  Allergen Reactions  . Azithromycin  His mother said he was, he does not know what the reaction was.   Rash    Prior to Admission medications   Not on File        Lab Results Last 48 Hours        Results for orders placed or performed during the hospital encounter of 09/02/16 (from the past 48 hour(s))  CBC     Status: Abnormal    Collection Time: 09/02/16  8:43 AM  Result Value Ref Range    WBC 17.7 (H) 4.0 - 10.5 K/uL    RBC 5.06 4.22 - 5.81 MIL/uL    Hemoglobin 15.5 13.0 - 17.0 g/dL    HCT 45.0 39.0 - 52.0 %    MCV 88.9 78.0 - 100.0 fL  MCH 30.6 26.0 - 34.0 pg    MCHC 34.4 30.0 - 36.0 g/dL    RDW 13.5 11.5 - 15.5 %    Platelets 237 150 - 400 K/uL  Comprehensive metabolic panel     Status: Abnormal    Collection Time: 09/02/16  8:43 AM  Result Value Ref Range    Sodium 139 135 - 145 mmol/L    Potassium 4.2 3.5 - 5.1 mmol/L    Chloride 105 101 - 111 mmol/L    CO2 21 (L) 22 - 32 mmol/L    Glucose, Bld 135 (H) 65 - 99 mg/dL    BUN 12 6 - 20 mg/dL    Creatinine, Ser 0.99 0.61 - 1.24 mg/dL    Calcium 9.6 8.9 - 10.3 mg/dL    Total Protein 6.6 6.5 - 8.1 g/dL    Albumin 4.3 3.5 - 5.0 g/dL    AST 24 15 - 41 U/L    ALT 45 17 - 63 U/L    Alkaline Phosphatase 78 38 - 126 U/L    Total Bilirubin 0.4 0.3 - 1.2 mg/dL    GFR calc non Af Amer >60 >60 mL/min    GFR calc Af Amer >60 >60 mL/min      Comment: (NOTE) The eGFR has been calculated using the CKD EPI equation. This calculation has not been validated in all clinical situations. eGFR's persistently <60 mL/min signify possible Chronic Kidney Disease.      Anion gap 13 5 - 15  Lipase, blood     Status: Abnormal    Collection Time: 09/02/16  8:43 AM  Result Value Ref Range    Lipase <10 (L) 11 - 51  U/L      Comment: RESULT REPEATED AND VERIFIED  I-stat troponin, ED     Status: None    Collection Time: 09/02/16  8:50 AM  Result Value Ref Range    Troponin i, poc 0.00 0.00 - 0.08 ng/mL    Comment 3               Comment: Due to the release kinetics of cTnI, a negative result within the first hours of the onset of symptoms does not rule out myocardial infarction with certainty. If myocardial infarction is still suspected, repeat the test at appropriate intervals.           Imaging Results (Last 48 hours)  Dg Chest 2 View   Result Date: 09/02/2016 CLINICAL DATA:  Severe chest/epigastric pain EXAM: CHEST  2 VIEW COMPARISON:  12/19/2013 FINDINGS: Lungs are clear.  No pleural effusion or pneumothorax. The heart is normal in size. Visualized osseous structures are within normal limits. IMPRESSION: Normal chest radiographs. Electronically Signed   By: Julian Hy M.D.   On: 09/02/2016 08:50    US Abdomen Limited Ruq   Result Date: 09/02/2016 CLINICAL DATA:  Right upper quadrant pain, some nausea and vomiting over the last 24 hours EXAM: US ABDOMEN LIMITED - RIGHT UPPER QUADRANT COMPARISON:  CT abdomen pelvis of 12/27/2010 FINDINGS: Gallbladder: The gallbladder is visualized and there are gallstones present, the largest of 2.5 cm with acoustical shadowing. No gallbladder wall thickening is seen and there is no pain over the gallbladder with compression. Common bile duct: Diameter: The common bile duct measures 4.8 mm in diameter. Liver: The liver is inhomogeneous suggesting fatty infiltration but no focal hepatic abnormality is seen. IMPRESSION: 1. Gallstones, the largest of 2.5 cm. No present ultrasound evidence of acute cholecystitis is seen. 2.  Inhomogeneous liver parenchyma suggesting fatty infiltration. Correlate with LFTs. Electronically Signed   By: Ivar Drape M.D.   On: 09/02/2016 10:48       Review of Systems  Constitutional: Negative for chills, diaphoresis, fever,  malaise/fatigue and weight loss.  HENT: Negative.   Eyes: Negative.   Respiratory: Positive for wheezing. Negative for cough, hemoptysis, sputum production and shortness of breath.   Cardiovascular: Positive for chest pain and palpitations (not recently, but has had this with anxiety). Negative for orthopnea, claudication and leg swelling.  Gastrointestinal: Positive for abdominal pain, heartburn, nausea and vomiting. Negative for blood in stool, constipation, diarrhea and melena.  Genitourinary: Negative.        Not peeing much today   Musculoskeletal: Negative.   Skin: Negative.   Neurological: Negative.  Negative for weakness.  Endo/Heme/Allergies: Negative.   Psychiatric/Behavioral: Negative.     Blood pressure 144/93, pulse 64, temperature 97.8 F (36.6 C), temperature source Oral, resp. rate 21, height 6' 1" (1.854 m), weight 117 kg (258 lb), SpO2 92 %. Physical Exam  Constitutional: He is oriented to person, place, and time. He appears well-developed and well-nourished.  C/o pain RUQ, and mid epigastric site  HENT:  Head: Normocephalic and atraumatic.  Mouth/Throat: No oropharyngeal exudate.  Eyes: Right eye exhibits no discharge. Left eye exhibits no discharge. No scleral icterus.  Neck: Normal range of motion. Neck supple. No JVD present. No tracheal deviation present. No thyromegaly present.  Cardiovascular: Regular rhythm, normal heart sounds and intact distal pulses.  Exam reveals no gallop and no friction rub.   No murmur heard. bradycardic  Respiratory: Effort normal and breath sounds normal. No respiratory distress. He has no wheezes. He has no rales. He exhibits no tenderness.  GI: Soft. Bowel sounds are normal. He exhibits no distension and no mass. There is tenderness. There is no rebound and no guarding.  Musculoskeletal: He exhibits no edema or tenderness.  Lymphadenopathy:    He has no cervical adenopathy.  Neurological: He is alert and oriented to person, place,  and time. No cranial nerve deficit.  Skin: Skin is warm and dry. No rash noted. He is not diaphoretic. No erythema. No pallor.  Psychiatric: He has a normal mood and affect. His behavior is normal. Judgment and thought content normal.      Assessment/Plan: RUQ pain, nausea and vomiting Cholelithiasis Hx of gastritis Hx of ETOH abuse, decreased over the last year Tobacco use 25 years 2.5 PPD GERD     Plan:  Admit, hydrate, IV rocephin, PPI, HIDA scan.  Clears if he can tolerate it this PM, and NPO after mn. I want to be sure it's his GB and not a gastric ulcer or gastritis.  Recheck labs in AM also.     JENNINGS,WILLARD 09/02/2016, 12:56 PM

## 2016-09-02 NOTE — ED Notes (Signed)
Attempted early report

## 2016-09-03 ENCOUNTER — Observation Stay (HOSPITAL_COMMUNITY): Payer: BLUE CROSS/BLUE SHIELD | Admitting: Certified Registered Nurse Anesthetist

## 2016-09-03 ENCOUNTER — Encounter (HOSPITAL_COMMUNITY)
Admission: EM | Disposition: A | Discharge status: Home / Self Care | Payer: Self-pay | Source: Home / Self Care | Attending: Emergency Medicine

## 2016-09-03 DIAGNOSIS — R079 Chest pain, unspecified: Secondary | ICD-10-CM | POA: Diagnosis not present

## 2016-09-03 DIAGNOSIS — K8010 Calculus of gallbladder with chronic cholecystitis without obstruction: Secondary | ICD-10-CM | POA: Diagnosis not present

## 2016-09-03 DIAGNOSIS — K219 Gastro-esophageal reflux disease without esophagitis: Secondary | ICD-10-CM | POA: Diagnosis not present

## 2016-09-03 DIAGNOSIS — K2970 Gastritis, unspecified, without bleeding: Secondary | ICD-10-CM | POA: Diagnosis not present

## 2016-09-03 DIAGNOSIS — K8000 Calculus of gallbladder with acute cholecystitis without obstruction: Secondary | ICD-10-CM | POA: Diagnosis not present

## 2016-09-03 HISTORY — PX: CHOLECYSTECTOMY: SHX55

## 2016-09-03 LAB — COMPREHENSIVE METABOLIC PANEL
ALT: UNDETERMINED U/L (ref 17–63)
AST: UNDETERMINED U/L (ref 15–41)
Albumin: 4.1 g/dL (ref 3.5–5.0)
Alkaline Phosphatase: UNDETERMINED U/L (ref 38–126)
Anion gap: 10 (ref 5–15)
BUN: 5 mg/dL — AB (ref 6–20)
CO2: 23 mmol/L (ref 22–32)
CREATININE: 0.98 mg/dL (ref 0.61–1.24)
Calcium: 9 mg/dL (ref 8.9–10.3)
Chloride: 106 mmol/L (ref 101–111)
GFR calc non Af Amer: >60 mL/min (ref >60)
GLUCOSE: 115 mg/dL — AB (ref 65–99)
Potassium: 4.2 mmol/L (ref 3.5–5.1)
SODIUM: 139 mmol/L (ref 135–145)
Total Bilirubin: UNDETERMINED mg/dL (ref 0.3–1.2)
Total Protein: 6.7 g/dL (ref 6.5–8.1)

## 2016-09-03 LAB — CBC
HCT: 48.3 % (ref 39.0–52.0)
Hemoglobin: 16.6 g/dL (ref 13.0–17.0)
MCH: 30.7 pg (ref 26.0–34.0)
MCHC: 34.4 g/dL (ref 30.0–36.0)
MCV: 89.4 fL (ref 78.0–100.0)
PLATELETS: 192 10*3/uL (ref 150–400)
RBC: 5.4 MIL/uL (ref 4.22–5.81)
RDW: 14 % (ref 11.5–15.5)
WBC: 14.5 10*3/uL — ABNORMAL HIGH (ref 4.0–10.5)

## 2016-09-03 LAB — SURGICAL PCR SCREEN
MRSA, PCR: NEGATIVE
Staphylococcus aureus: POSITIVE — AB

## 2016-09-03 LAB — HIV ANTIBODY (ROUTINE TESTING W REFLEX): HIV Screen 4th Generation wRfx: NONREACTIVE

## 2016-09-03 LAB — LIPASE, BLOOD: LIPASE: UNDETERMINED U/L (ref 11–51)

## 2016-09-03 SURGERY — LAPAROSCOPIC CHOLECYSTECTOMY
Anesthesia: General | Site: Abdomen

## 2016-09-03 MED ORDER — LIDOCAINE HCL (CARDIAC) 20 MG/ML IV SOLN
INTRAVENOUS | Status: DC | PRN
  Administered 2016-09-03: 80 mg via INTRAVENOUS

## 2016-09-03 MED ORDER — LACTATED RINGERS IV SOLN
INTRAVENOUS | Status: DC
  Administered 2016-09-03 (×2): via INTRAVENOUS

## 2016-09-03 MED ORDER — FENTANYL CITRATE (PF) 100 MCG/2ML IJ SOLN
INTRAMUSCULAR | Status: AC
  Filled 2016-09-03: qty 4

## 2016-09-03 MED ORDER — SCOPOLAMINE 1 MG/3DAYS TD PT72
MEDICATED_PATCH | TRANSDERMAL | Status: AC
  Administered 2016-09-03: 1.5 mg
  Filled 2016-09-03: qty 1

## 2016-09-03 MED ORDER — CEFAZOLIN SODIUM 1 G IJ SOLR
INTRAMUSCULAR | Status: DC | PRN
  Administered 2016-09-03: 2 g

## 2016-09-03 MED ORDER — DEXAMETHASONE SODIUM PHOSPHATE 10 MG/ML IJ SOLN
INTRAMUSCULAR | Status: DC | PRN
  Administered 2016-09-03: 10 mg via INTRAVENOUS

## 2016-09-03 MED ORDER — PROPOFOL 10 MG/ML IV BOLUS
INTRAVENOUS | Status: AC
  Filled 2016-09-03: qty 40

## 2016-09-03 MED ORDER — PROPOFOL 10 MG/ML IV BOLUS
INTRAVENOUS | Status: DC | PRN
  Administered 2016-09-03: 30 mg via INTRAVENOUS
  Administered 2016-09-03: 250 mg via INTRAVENOUS

## 2016-09-03 MED ORDER — MUPIROCIN 2 % EX OINT
1.0000 "application " | TOPICAL_OINTMENT | Freq: Two times a day (BID) | CUTANEOUS | Status: DC
  Administered 2016-09-03 – 2016-09-04 (×2): 1 via NASAL
  Filled 2016-09-03: qty 22

## 2016-09-03 MED ORDER — SODIUM CHLORIDE 0.9 % IV SOLN
INTRAVENOUS | Status: DC
  Administered 2016-09-03: 21:00:00 via INTRAVENOUS

## 2016-09-03 MED ORDER — 0.9 % SODIUM CHLORIDE (POUR BTL) OPTIME
TOPICAL | Status: DC | PRN
  Administered 2016-09-03: 1000 mL

## 2016-09-03 MED ORDER — SUGAMMADEX SODIUM 500 MG/5ML IV SOLN
INTRAVENOUS | Status: DC | PRN
  Administered 2016-09-03: 250 mg via INTRAVENOUS

## 2016-09-03 MED ORDER — ONDANSETRON HCL 4 MG/2ML IJ SOLN
4.0000 mg | Freq: Once | INTRAMUSCULAR | Status: DC
  Filled 2016-09-03: qty 2

## 2016-09-03 MED ORDER — ONDANSETRON HCL 4 MG/2ML IJ SOLN
INTRAMUSCULAR | Status: DC | PRN
  Administered 2016-09-03: 4 mg via INTRAVENOUS

## 2016-09-03 MED ORDER — PHENYLEPHRINE HCL 10 MG/ML IJ SOLN
INTRAMUSCULAR | Status: DC | PRN
  Administered 2016-09-03 (×2): 40 ug via INTRAVENOUS

## 2016-09-03 MED ORDER — FENTANYL CITRATE (PF) 100 MCG/2ML IJ SOLN
INTRAMUSCULAR | Status: DC | PRN
  Administered 2016-09-03 (×2): 50 ug via INTRAVENOUS
  Administered 2016-09-03: 100 ug via INTRAVENOUS

## 2016-09-03 MED ORDER — BUPIVACAINE HCL (PF) 0.25 % IJ SOLN
INTRAMUSCULAR | Status: AC
Start: 2016-09-03 — End: 2016-09-03
  Filled 2016-09-03: qty 30

## 2016-09-03 MED ORDER — BUPIVACAINE HCL 0.25 % IJ SOLN
INTRAMUSCULAR | Status: DC | PRN
  Administered 2016-09-03: 13 mL

## 2016-09-03 MED ORDER — HYDROMORPHONE HCL 1 MG/ML IJ SOLN
0.2500 mg | INTRAMUSCULAR | Status: DC | PRN
  Administered 2016-09-03: 0.5 mg via INTRAVENOUS

## 2016-09-03 MED ORDER — SCOPOLAMINE 1 MG/3DAYS TD PT72: 1.0000 | MEDICATED_PATCH | TRANSDERMAL | Status: DC

## 2016-09-03 MED ORDER — HYDROMORPHONE HCL 1 MG/ML IJ SOLN
INTRAMUSCULAR | Status: AC
  Filled 2016-09-03: qty 0.5

## 2016-09-03 MED ORDER — OXYCODONE-ACETAMINOPHEN 5-325 MG PO TABS
1.0000 | ORAL_TABLET | ORAL | Status: DC | PRN
  Administered 2016-09-03 – 2016-09-04 (×2): 2 via ORAL
  Filled 2016-09-03 (×2): qty 2

## 2016-09-03 MED ORDER — SODIUM CHLORIDE 0.9 % IR SOLN
Status: DC | PRN
  Administered 2016-09-03: 3000 mL
  Administered 2016-09-03 (×3): 1000 mL

## 2016-09-03 MED ORDER — MIDAZOLAM HCL 5 MG/5ML IJ SOLN
INTRAMUSCULAR | Status: DC | PRN
  Administered 2016-09-03: 2 mg via INTRAVENOUS

## 2016-09-03 MED ORDER — ROCURONIUM BROMIDE 100 MG/10ML IV SOLN
INTRAVENOUS | Status: DC | PRN
  Administered 2016-09-03: 50 mg via INTRAVENOUS

## 2016-09-03 MED ORDER — ALBUTEROL SULFATE HFA 108 (90 BASE) MCG/ACT IN AERS
INHALATION_SPRAY | RESPIRATORY_TRACT | Status: DC | PRN
  Administered 2016-09-03: 2 via RESPIRATORY_TRACT
  Administered 2016-09-03: 4 via RESPIRATORY_TRACT

## 2016-09-03 MED ORDER — DIPHENHYDRAMINE HCL 50 MG/ML IJ SOLN
INTRAMUSCULAR | Status: DC | PRN
  Administered 2016-09-03: 12.5 mg via INTRAVENOUS

## 2016-09-03 MED ORDER — SUCCINYLCHOLINE CHLORIDE 200 MG/10ML IV SOSY
PREFILLED_SYRINGE | INTRAVENOUS | Status: DC | PRN
  Administered 2016-09-03: 140 mg via INTRAVENOUS

## 2016-09-03 MED ORDER — MIDAZOLAM HCL 2 MG/2ML IJ SOLN
INTRAMUSCULAR | Status: AC
Start: 2016-09-03 — End: 2016-09-03
  Filled 2016-09-03: qty 2

## 2016-09-03 MED ORDER — CHLORHEXIDINE GLUCONATE CLOTH 2 % EX PADS
6.0000 | MEDICATED_PAD | Freq: Every day | CUTANEOUS | Status: DC
  Administered 2016-09-04: 6 via TOPICAL

## 2016-09-03 MED ORDER — HEMOSTATIC AGENTS (NO CHARGE) OPTIME
TOPICAL | Status: DC | PRN
  Administered 2016-09-03 (×3): 1 via TOPICAL

## 2016-09-03 MED ORDER — PROMETHAZINE HCL 25 MG/ML IJ SOLN: 6.2500 mg | INTRAMUSCULAR | Status: DC | PRN

## 2016-09-03 SURGICAL SUPPLY — 49 items
ADH SKN CLS APL DERMABOND .7 (GAUZE/BANDAGES/DRESSINGS) ×1
APPLIER CLIP 5 13 M/L LIGAMAX5 (MISCELLANEOUS) ×4
APR CLP MED LRG 5 ANG JAW (MISCELLANEOUS) ×2
BAG SPEC RTRVL 10 TROC 200 (ENDOMECHANICALS) ×1
BLADE CLIPPER SURG (BLADE) IMPLANT
CANISTER SUCT 3000ML PPV (MISCELLANEOUS) ×3 IMPLANT
CHLORAPREP W/TINT 26ML (MISCELLANEOUS) ×2 IMPLANT
CLIP APPLIE 5 13 M/L LIGAMAX5 (MISCELLANEOUS) ×1 IMPLANT
COVER SURGICAL LIGHT HANDLE (MISCELLANEOUS) ×2 IMPLANT
DERMABOND ADVANCED (GAUZE/BANDAGES/DRESSINGS) ×1
DERMABOND ADVANCED .7 DNX12 (GAUZE/BANDAGES/DRESSINGS) ×1 IMPLANT
DEVICE TROCAR PUNCTURE CLOSURE (ENDOMECHANICALS) ×2 IMPLANT
DRAPE PROXIMA HALF (DRAPES) ×1 IMPLANT
ELECT REM PT RETURN 9FT ADLT (ELECTROSURGICAL) ×2
ELECTRODE REM PT RTRN 9FT ADLT (ELECTROSURGICAL) ×1 IMPLANT
GLOVE BIO SURGEON STRL SZ7 (GLOVE) ×4 IMPLANT
GLOVE BIOGEL PI IND STRL 6.5 (GLOVE) IMPLANT
GLOVE BIOGEL PI IND STRL 7.5 (GLOVE) ×2 IMPLANT
GLOVE BIOGEL PI IND STRL 8 (GLOVE) IMPLANT
GLOVE BIOGEL PI INDICATOR 6.5 (GLOVE) ×1
GLOVE BIOGEL PI INDICATOR 7.5 (GLOVE) ×2
GLOVE BIOGEL PI INDICATOR 8 (GLOVE) ×1
GLOVE ECLIPSE 6.0 STRL STRAW (GLOVE) ×2 IMPLANT
GLOVE ECLIPSE 7.5 STRL STRAW (GLOVE) ×1 IMPLANT
GOWN STRL REUS W/ TWL LRG LVL3 (GOWN DISPOSABLE) ×4 IMPLANT
GOWN STRL REUS W/ TWL XL LVL3 (GOWN DISPOSABLE) ×1 IMPLANT
GOWN STRL REUS W/TWL LRG LVL3 (GOWN DISPOSABLE) ×8
GOWN STRL REUS W/TWL XL LVL3 (GOWN DISPOSABLE) ×2
HEMOSTAT SNOW SURGICEL 2X4 (HEMOSTASIS) ×6 IMPLANT
KIT BASIN OR (CUSTOM PROCEDURE TRAY) ×2 IMPLANT
KIT ROOM TURNOVER OR (KITS) ×2 IMPLANT
NS IRRIG 1000ML POUR BTL (IV SOLUTION) ×2 IMPLANT
PAD ARMBOARD 7.5X6 YLW CONV (MISCELLANEOUS) ×2 IMPLANT
POUCH RETRIEVAL ECOSAC 10 (ENDOMECHANICALS) ×1 IMPLANT
POUCH RETRIEVAL ECOSAC 10MM (ENDOMECHANICALS) ×1
SCISSORS LAP 5X35 DISP (ENDOMECHANICALS) ×2 IMPLANT
SET IRRIG TUBING LAPAROSCOPIC (IRRIGATION / IRRIGATOR) ×2 IMPLANT
SLEEVE ENDOPATH XCEL 5M (ENDOMECHANICALS) ×5 IMPLANT
SPECIMEN JAR MEDIUM (MISCELLANEOUS) ×1 IMPLANT
SPECIMEN JAR SMALL (MISCELLANEOUS) ×1 IMPLANT
STRIP CLOSURE SKIN 1/2X4 (GAUZE/BANDAGES/DRESSINGS) ×2 IMPLANT
SUT MNCRL AB 4-0 PS2 18 (SUTURE) ×2 IMPLANT
SUT VICRYL 0 UR6 27IN ABS (SUTURE) ×4 IMPLANT
TOWEL OR 17X24 6PK STRL BLUE (TOWEL DISPOSABLE) ×2 IMPLANT
TOWEL OR 17X26 10 PK STRL BLUE (TOWEL DISPOSABLE) ×2 IMPLANT
TRAY LAPAROSCOPIC MC (CUSTOM PROCEDURE TRAY) ×2 IMPLANT
TROCAR XCEL BLUNT TIP 100MML (ENDOMECHANICALS) ×2 IMPLANT
TROCAR XCEL NON-BLD 5MMX100MML (ENDOMECHANICALS) ×2 IMPLANT
TUBING INSUFFLATION (TUBING) ×2 IMPLANT

## 2016-09-03 NOTE — Op Note (Signed)
Preoperative diagnosis: Acute cholecystitis Postoperative diagnosis: Same as above Procedure: Laparoscopic cholecystectomy Surgeon: Dr. Harden Mo Assistant: Dr. Marcille Blanco Anesthesia: Gen. Estimated blood loss: 100 mL Drains: None Complications: None Specimens: Gallbladder and contents to pathology. Sponge count was correct at completion Dietitian to recovery stable  Indications: This a 41 year old male presents with right upper quadrant pain. He has an ultrasound and a clinical exam consistent with cholecystitis in association with elevated white blood cell count. I discussed with him going to the operating room for laparoscopic cholecystectomy. Risks and benefits were discussed prior to beginning.  Procedure: After informed consent was obtained the patient was taken to the operating room. He had been given antibiotics. SCDs were in place. He was placed under general anesthesia without complication. His abdomen was prepped and draped in the standard sterile surgical fashion. A surgical timeout was then performed.  I infiltrated Marcaine below his umbilicus. I then made a vertical incision. I grasped the fascia and I incised it. I then entered into the peritoneum bluntly. There was no evidence of an entry injury. I then placed a 0 Vicryl purse string suture. I inserted a Hasson trocar and insufflated the abdomen to 15 mm Hg pressure. I then inserted three further five mm trocars in the epigastrium and ruq.  He was noted to have what appeared to be gangrenous cholecystitis. I had to aspirate the gallbladder just be able to grasp it. I then retracted cephalad. He had a lot of adhesions from his omentum and his duodenum inferiorly. These were taken down bluntly as I did not want to use cautery in this area. During this there was a small blood vessel on the duodenum that was bleeding. I controlled this with a small amount of cautery as well as a piece of Surgicel overlying it for the time. I then  proceeded to grasp the gallbladder and retract it laterally. With some difficulty I was eventually able to obtain the critical view of safety. I then clipped both an anterior and a posterior branch of the cystic duct leaving 2 clips in place and divided them. I then treated the cystic duct in a similar fashion leaving 2 clips in place. These clips traversed the duct completely. The duct was viable. I then removed the gallbladder from the liver bed with some difficulty as it was fused to the liver bed. I then placed this in a bag and was able to removed from the umbilicus. Due to the size of the stones had enlarged my umbilical incision just to remove the gallbladder and stones. I then washed out and packed the liver bed with Surgicel after cauterizing it. I then removed my hasson trocar and tied down my pursestring. I placed several additional 0 Vicryl sutures using the Endo Close device to completely obliterate the defect. When I looked back in my right upper quadrant there clearly was an area that was bleeding. I inserted an additional 5 mm trocar on the left side then. I was able to identify some oozing in the liver which was controlled with cautery. There was also some oozing from the triangle which was controlled with an additional clip on on the cystic artery stump. The main area that was bleeding was the small area near the duodenum when I was taking this down. With some assistance eventually I was able to identify the small artery which was bleeding. I did place several clips on this as it was at a branch point. This was hemostatic upon completion. I  then removed the remaining trocars. Abdomen is desufflated. I then closed with 4-0 Monocryl and glue. He tolerated this well was transferred to recovery.

## 2016-09-03 NOTE — Progress Notes (Signed)
Subjective: Less pain, ct otherwise negative  Objective: Vital signs in last 24 hours: Temp:  [97.8 F (36.6 C)-98.4 F (36.9 C)] 97.9 F (36.6 C) (03/14 0544) Pulse Rate:  [44-84] 81 (03/14 0544) Resp:  [13-24] 18 (03/14 0544) BP: (122-162)/(65-96) 144/94 (03/14 0544) SpO2:  [90 %-100 %] 94 % (03/14 0544) Weight:  [117 kg (258 lb)] 117 kg (258 lb) (03/13 0824) Last BM Date:  (pt does not know)  Intake/Output from previous day: 03/13 0701 - 03/14 0700 In: 750 [I.V.:750] Out: 0  Intake/Output this shift: No intake/output data recorded.  GI: moderate ruq and epigastric tenderness  Lab Results:   Recent Labs  09/02/16 0843 09/02/16 1503  WBC 17.7* 16.0*  HGB 15.5 15.3  HCT 45.0 45.2  PLT 237 250   BMET  Recent Labs  09/02/16 0843 09/02/16 1503  NA 139  --   K 4.2  --   CL 105  --   CO2 21*  --   GLUCOSE 135*  --   BUN 12  --   CREATININE 0.99 0.76  CALCIUM 9.6  --    PT/INR No results for input(s): LABPROT, INR in the last 72 hours. ABG No results for input(s): PHART, HCO3 in the last 72 hours.  Invalid input(s): PCO2, PO2  Studies/Results: Dg Chest 2 View  Result Date: 09/02/2016 CLINICAL DATA:  Severe chest/epigastric pain EXAM: CHEST  2 VIEW COMPARISON:  12/19/2013 FINDINGS: Lungs are clear.  No pleural effusion or pneumothorax. The heart is normal in size. Visualized osseous structures are within normal limits. IMPRESSION: Normal chest radiographs. Electronically Signed   By: Charline BillsSriyesh  Krishnan M.D.   On: 09/02/2016 08:50   Ct Abdomen Pelvis W Contrast  Result Date: 09/02/2016 CLINICAL DATA:  Abdomen pain with nausea and vomit EXAM: CT ABDOMEN AND PELVIS WITH CONTRAST TECHNIQUE: Multidetector CT imaging of the abdomen and pelvis was performed using the standard protocol following bolus administration of intravenous contrast. CONTRAST:  100mL ISOVUE-300 IOPAMIDOL (ISOVUE-300) INJECTION 61% COMPARISON:  12/27/2010, ultrasound 09/02/2016 FINDINGS:  Lower chest: Patchy dependent atelectasis. No consolidation or pleural effusion. Heart size upper normal. Hepatobiliary: Mild steatosis. No focal hepatic abnormality. Slightly enlarged gallbladder contains a partially calcified gas containing stone. No wall thickening. No biliary dilatation. Pancreas: Unremarkable. No pancreatic ductal dilatation or surrounding inflammatory changes. Spleen: Normal in size without focal abnormality. Adrenals/Urinary Tract: Adrenal glands are unremarkable. Kidneys are normal, without renal calculi, focal lesion, or hydronephrosis. Bladder is unremarkable. Stomach/Bowel: Stomach is within normal limits. Appendix appears normal. No evidence of bowel wall thickening, distention, or inflammatory changes. Vascular/Lymphatic: No significant vascular findings are present. No enlarged abdominal or pelvic lymph nodes. Reproductive: Prostate contains mild calcification. No obvious mass. Other: Tiny fat in the umbilicus.  No abdominopelvic ascites. Musculoskeletal: No acute or significant osseous findings. IMPRESSION: 1. Gallstone.  No wall thickening or biliary dilatation. 2. Mild hepatic steatosis 3. No acute intra-abdominal or pelvic abnormality. Normal appendix. No evidence for a bowel obstruction. Electronically Signed   By: Jasmine PangKim  Fujinaga M.D.   On: 09/02/2016 15:35   [place] Abdomen Limited Ruq  Result Date: 09/02/2016 CLINICAL DATA:  Right upper quadrant pain, some nausea and vomiting over the last 24 hours EXAM: US ABDOMEN LIMITED - RIGHT UPPER QUADRANT COMPARISON:  CT abdomen pelvis of 12/27/2010 FINDINGS: Gallbladder: The gallbladder is visualized and there are gallstones present, the largest of 2.5 cm with acoustical shadowing. No gallbladder wall thickening is seen and there is no pain over the gallbladder with compression.  Common bile duct: Diameter: The common bile duct measures 4.8 mm in diameter. Liver: The liver is inhomogeneous suggesting fatty infiltration but no focal hepatic  abnormality is seen. IMPRESSION: 1. Gallstones, the largest of 2.5 cm. No present ultrasound evidence of acute cholecystitis is seen. 2. Inhomogeneous liver parenchyma suggesting fatty infiltration. Correlate with LFTs. Electronically Signed   By: Dwyane Dee M.D.   On: 09/02/2016 10:48    Anti-infectives: Anti-infectives    Start     Dose/Rate Route Frequency Ordered Stop   09/02/16 1500  cefTRIAXone (ROCEPHIN) 2 g in dextrose 5 % 50 mL IVPB     2 g 100 mL/hr over 30 Minutes Intravenous Every 24 hours 09/02/16 1400        Assessment/Plan: Lap chole today for cholecystitis I discussed the procedure in detail.   We discussed the risks and benefits of a laparoscopic cholecystectomy and possible cholangiogram including, but not limited to bleeding, infection, injury to surrounding structures such as the intestine or liver, bile leak, retained gallstones, need to convert to an open procedure, prolonged diarrhea, blood clots such as  DVT, common bile duct injury, anesthesia risks, and possible need for additional procedures.  The likelihood of improvement in symptoms and return to the patient's normal status is good. We discussed the typical post-operative recovery course.   [facility] 09/03/2016

## 2016-09-03 NOTE — Anesthesia Preprocedure Evaluation (Addendum)
Anesthesia Evaluation  Patient identified by MRN, date of birth, ID band Patient awake    Reviewed: Allergy & Precautions, NPO status , Patient's Chart, lab work & pertinent test results  Airway Mallampati: II  TM Distance: >3 FB Neck ROM: Full    Dental  (+) Dental Advisory Given   Pulmonary Current Smoker,    breath sounds clear to auscultation       Cardiovascular negative cardio ROS   Rhythm:Regular Rate:Normal     Neuro/Psych negative neurological ROS     GI/Hepatic Neg liver ROS, GERD  ,  Endo/Other  negative endocrine ROS  Renal/GU negative Renal ROS     Musculoskeletal  (+) Arthritis ,   Abdominal   Peds  Hematology negative hematology ROS (+)   Anesthesia Other Findings   Reproductive/Obstetrics                            Lab Results  Component Value Date   WBC 14.5 (H) 09/03/2016   HGB 16.6 09/03/2016   HCT 48.3 09/03/2016   MCV 89.4 09/03/2016   PLT 192 09/03/2016   Lab Results  Component Value Date   CREATININE 0.76 09/02/2016   BUN 12 09/02/2016   NA 139 09/02/2016   K 4.2 09/02/2016   CL 105 09/02/2016   CO2 21 (L) 09/02/2016    Anesthesia Physical Anesthesia Plan  ASA: II  Anesthesia Plan: General   Post-op Pain Management:    Induction: Intravenous  Airway Management Planned: Oral ETT  Additional Equipment:   Intra-op Plan:   Post-operative Plan: Extubation in OR  Informed Consent: I have reviewed the patients History and Physical, chart, labs and discussed the procedure including the risks, benefits and alternatives for the proposed anesthesia with the patient or authorized representative who has indicated his/her understanding and acceptance.   Dental advisory given  Plan Discussed with: CRNA  Anesthesia Plan Comments:        Anesthesia Quick Evaluation

## 2016-09-03 NOTE — Anesthesia Procedure Notes (Signed)
Procedure Name: Intubation Date/Time: 09/03/2016 10:12 AM Performed by: Valda Favia Pre-anesthesia Checklist: Patient identified, Emergency Drugs available, Suction available, Patient being monitored and Timeout performed Patient Re-evaluated:Patient Re-evaluated prior to inductionOxygen Delivery Method: Circle system utilized Preoxygenation: Pre-oxygenation with 100% oxygen Intubation Type: IV induction and Rapid sequence Laryngoscope Size: Mac and 4 Grade View: Grade I Tube type: Oral Tube size: 7.5 mm Number of attempts: 1 Airway Equipment and Method: Stylet Placement Confirmation: ETT inserted through vocal cords under direct vision,  positive ETCO2 and breath sounds checked- equal and bilateral Secured at: 23 cm Tube secured with: Tape Dental Injury: Teeth and Oropharynx as per pre-operative assessment

## 2016-09-03 NOTE — Progress Notes (Addendum)
Spoke with Dr. Glade Stanford. Fitzgerald regarding pt. Complaints of nausea & severe headache. Order given.

## 2016-09-03 NOTE — Transfer of Care (Signed)
Immediate Anesthesia Transfer of Care Note  Patient: Jonathan Vaughn  Procedure(s) Performed: Procedure(s): LAPAROSCOPIC CHOLECYSTECTOMY (N/A)  Patient Location: PACU  Anesthesia Type:General  Level of Consciousness: sedated  Airway & Oxygen Therapy: Patient Spontanous Breathing and Patient connected to nasal cannula oxygen  Post-op Assessment: Report given to RN and Post -op Vital signs reviewed and stable  Post vital signs: Reviewed and stable  Last Vitals:  Vitals:   09/03/16 0544 09/03/16 1225  BP: (!) 144/94 126/80  Pulse: 81 94  Resp: 18 (!) 24  Temp: 36.6 C     Last Pain:  Vitals:   09/03/16 0544  TempSrc: Oral  PainSc:          Complications: No apparent anesthesia complications

## 2016-09-03 NOTE — Anesthesia Postprocedure Evaluation (Addendum)
Anesthesia Post Note  Patient: Jonathan Vaughn  Procedure(s) Performed: Procedure(s) (LRB): LAPAROSCOPIC CHOLECYSTECTOMY (N/A)  Patient location during evaluation: PACU Anesthesia Type: General Level of consciousness: sedated Pain management: pain level controlled Vital Signs Assessment: post-procedure vital signs reviewed and stable Respiratory status: spontaneous breathing and respiratory function stable Cardiovascular status: stable Anesthetic complications: no       Last Vitals:  Vitals:   09/03/16 1329 09/03/16 1344  BP:  126/74  Pulse:  80  Resp:    Temp: 36.5 C     Last Pain:  Vitals:   09/03/16 1248  TempSrc:   PainSc: 10-Worst pain ever                 Maryln Eastham DANIEL

## 2016-09-03 NOTE — Progress Notes (Signed)
Spoke with Dr. Dwain SarnaWakefield, reviewed most recent order for labs, directive rec'd to omit those labs.

## 2016-09-04 ENCOUNTER — Encounter (HOSPITAL_COMMUNITY): Payer: Self-pay | Admitting: General Surgery

## 2016-09-04 LAB — CBC
HEMATOCRIT: 39 % (ref 39.0–52.0)
HEMOGLOBIN: 13.1 g/dL (ref 13.0–17.0)
MCH: 30.3 pg (ref 26.0–34.0)
MCHC: 33.6 g/dL (ref 30.0–36.0)
MCV: 90.1 fL (ref 78.0–100.0)
Platelets: 206 10*3/uL (ref 150–400)
RBC: 4.33 MIL/uL (ref 4.22–5.81)
RDW: 13.4 % (ref 11.5–15.5)
WBC: 16 10*3/uL — ABNORMAL HIGH (ref 4.0–10.5)

## 2016-09-04 MED ORDER — AMOXICILLIN-POT CLAVULANATE 875-125 MG PO TABS: 1.0000 | ORAL_TABLET | Freq: Two times a day (BID) | ORAL | 0 refills | Status: AC

## 2016-09-04 MED ORDER — PANTOPRAZOLE SODIUM 40 MG PO TBEC
40.0000 mg | DELAYED_RELEASE_TABLET | Freq: Two times a day (BID) | ORAL | Status: DC
  Administered 2016-09-04: 40 mg via ORAL
  Filled 2016-09-04: qty 1

## 2016-09-04 MED ORDER — IBUPROFEN 600 MG PO TABS: 600.0000 mg | ORAL_TABLET | Freq: Four times a day (QID) | ORAL | Status: DC | PRN

## 2016-09-04 MED ORDER — OXYCODONE-ACETAMINOPHEN 5-325 MG PO TABS: 1.0000 | ORAL_TABLET | ORAL | 0 refills | Status: DC | PRN

## 2016-09-04 MED ORDER — ACETAMINOPHEN 325 MG PO TABS: 650.0000 mg | ORAL_TABLET | Freq: Four times a day (QID) | ORAL | Status: DC | PRN

## 2016-09-04 NOTE — Discharge Summary (Signed)
[facility] Discharge Summary   Patient ID: Jonathan Vaughn MRN: [id number] DOB/AGE: 09/29/1975 41 y.o.  Admit date: 09/02/2016 Discharge date: 09/04/2016  Admitting Diagnosis: Cholelithiasis with acute cholecystitis   Discharge Diagnosis Patient Active Problem List   Diagnosis Date Noted  . Cholelithiasis with acute cholecystitis 09/02/2016  . GERD 03/21/2010  . GASTRITIS 03/21/2010  . SHOULDER PAIN 01/23/2010  . VIRAL INFECTION 08/03/2009  . LATERAL EPICONDYLITIS OF ELBOW 08/03/2009  . HEADACHE 01/10/2009  . CHEST PAIN UNSPECIFIED 01/10/2009    Consultants None  Imaging: Ct Abdomen Pelvis W Contrast  Result Date: 09/02/2016 CLINICAL DATA:  Abdomen pain with nausea and vomit EXAM: CT ABDOMEN AND PELVIS WITH CONTRAST TECHNIQUE: Multidetector CT imaging of the abdomen and pelvis was performed using the standard protocol following bolus administration of intravenous contrast. CONTRAST:  ISOVUE-300 IOPAMIDOL (ISOVUE-300) INJECTION 61% COMPARISON:  12/27/2010, ultrasound 09/02/2016 FINDINGS: Lower chest: Patchy dependent atelectasis. No consolidation or pleural effusion. Heart size upper normal. Hepatobiliary: Mild steatosis. No focal hepatic abnormality. Slightly enlarged gallbladder contains a partially calcified gas containing stone. No wall thickening. No biliary dilatation. Pancreas: Unremarkable. No pancreatic ductal dilatation or surrounding inflammatory changes. Spleen: Normal in size without focal abnormality. Adrenals/Urinary Tract: Adrenal glands are unremarkable. Kidneys are normal, without renal calculi, focal lesion, or hydronephrosis. Bladder is unremarkable. Stomach/Bowel: Stomach is within normal limits. Appendix appears normal. No evidence of bowel wall thickening, distention, or inflammatory changes. Vascular/Lymphatic: No significant vascular findings are present. No enlarged abdominal or pelvic lymph nodes. Reproductive: Prostate contains mild  calcification. No obvious mass. Other: Tiny fat in the umbilicus.  No abdominopelvic ascites. Musculoskeletal: No acute or significant osseous findings. IMPRESSION: 1. Gallstone.  No wall thickening or biliary dilatation. 2. Mild hepatic steatosis 3. No acute intra-abdominal or pelvic abnormality. Normal appendix. No evidence for a bowel obstruction. Electronically Signed   By: Jasmine Pang M.D.   On: 09/02/2016 15:35    Procedures Dr. Dwain Sarna (09/03/16) - Laparoscopic Cholecystectomy   Hospital Course:  Jonathan Millea is a 41 year old male who presented to [facility] with abdominal pain.  Workup showed cholelithiasis with acute cholecystitis.  Patient was admitted and underwent procedure listed above.  Tolerated procedure well and was transferred to the floor.  Diet was advanced as tolerated.  On POD#1, the patient was voiding well, tolerating diet, ambulating well, pain well controlled, vital signs stable, Hg stable, incisions c/d/i and felt stable for discharge home.  Patient will follow up in our office in 2 weeks and knows to call with questions or concerns. He will call to confirm appointment date/time.    Patient was discharged in good condition.  The [place] Substance controlled database was reviewed prior to prescribing narcotic pain medication to this patient.  I did not see or take part in the care of this patient. The information provided in this discharge summary was taken from the patients EMR.   Allergies as of 09/04/2016      Reactions   Azithromycin Rash      Medication List    TAKE these medications   amoxicillin-clavulanate 875-125 MG tablet Commonly known as:  AUGMENTIN Take 1 tablet by mouth 2 (two) times daily.   oxyCODONE-acetaminophen 5-325 MG tablet Commonly known as:  PERCOCET/ROXICET Take 1 tablet by mouth every 4 (four) hours as needed for moderate pain.        Follow-up Information    [facility] Follow up on 09/30/2016.   Specialty:   General Surgery Why:  Your appointment is ta 9:45 AM, be at the office 30 minutes early with photo ID and Insurance information.   Contact information: [address] [place] [address] [phone]           Signed: Joyce CopaJessica L [facility] 09/04/2016, 10:42 AM Pager: [phone] Consults: [phone] Mon-Fri 7:00 am-4:30 pm Sat-Sun 7:00 am-11:30 am

## 2016-09-04 NOTE — Discharge Instructions (Signed)
[facility] ______[facility] °LAPAROSCOPIC SURGERY: POST OP INSTRUCTIONS °Always review your discharge instruction sheet given to you by the facility where your surgery was performed. °IF YOU HAVE DISABILITY OR FAMILY LEAVE FORMS, YOU MUST BRING THEM TO THE OFFICE FOR PROCESSING.   °DO NOT GIVE THEM TO YOUR DOCTOR. ° °1. A prescription for pain medication may be given to you upon discharge.  Take your pain medication as prescribed, if needed.  If narcotic pain medicine is not needed, then you may take acetaminophen (Tylenol) or ibuprofen (Advil) as needed. °2. Take your usually prescribed medications unless otherwise directed. °3. If you need a refill on your pain medication, please contact your pharmacy.  They will contact our office to request authorization. Prescriptions will not be filled after 5pm or on week-ends. °4. You should follow a light diet the first few days after arrival home, such as soup and crackers, etc.  Be sure to include lots of fluids daily. °5. Most patients will experience some swelling and bruising in the area of the incisions.  Ice packs will help.  Swelling and bruising can take several days to resolve.  °6. It is common to experience some constipation if taking pain medication after surgery.  Increasing fluid intake and taking a stool softener (such as Colace) will usually help or prevent this problem from occurring.  A mild laxative (Milk of Magnesia or Miralax) should be taken according to package instructions if there are no bowel movements after 48 hours. °7. Unless discharge instructions indicate otherwise, you may remove your bandages 24-48 hours after surgery, and you may shower at that time.  You may have steri-strips (small skin tapes) in place directly over the incision.  These strips should be left on the skin for 7-10 days.  If your surgeon used skin glue on the incision, you may shower in 24 hours.  The glue will flake off over the next 2-3 weeks.  Any sutures or  staples will be removed at the office during your follow-up visit. °8. ACTIVITIES:  You may resume regular (light) daily activities beginning the next day--such as daily self-care, walking, climbing stairs--gradually increasing activities as tolerated.  You may have sexual intercourse when it is comfortable.  Refrain from any heavy lifting or straining until approved by your doctor. °a. You may drive when you are no longer taking prescription pain medication, you can comfortably wear a seatbelt, and you can safely maneuver your car and apply brakes. °b. RETURN TO WORK:  __________________________________________________________ °9. You should see your doctor in the office for a follow-up appointment approximately 2-3 weeks after your surgery.  Make sure that you call for this appointment within a day or two after you arrive home to insure a convenient appointment time. °10. OTHER INSTRUCTIONS: __________________________________________________________________________________________________________________________ __________________________________________________________________________________________________________________________ °WHEN TO CALL YOUR DOCTOR: °1. Fever over 101.0 °2. Inability to urinate °3. Continued bleeding from incision. °4. Increased pain, redness, or drainage from the incision. °5. Increasing abdominal pain ° °The clinic staff is available to answer your questions during regular business hours.  Please don’t hesitate to call and ask to speak to one of the nurses for clinical concerns.  If you have a medical emergency, go to the nearest emergency room or call 911.  A surgeon from [facility] is always on call at the hospital. °[address], [address], [place], [place]  [address] ? [address] [address], [place], [place]   [address] °[phone] ? [phone] ? FAX [phone] °Web site:   [email or web address] ° ° °Laparoscopic Cholecystectomy, Care After °This sheet  gives you information about how to care for yourself after your procedure. Your health care provider may also give you more specific instructions. If you have problems or questions, contact your health care provider. °What can I expect after the procedure? °After the procedure, it is common to have: °· Pain at your incision sites. You will be given medicines to control this pain. °· Mild nausea or vomiting. °· Bloating and possible shoulder pain from the air-like gas that was used during the procedure. ° °Follow these instructions at home: °Incision care ° °· Follow instructions from your health care provider about how to take care of your incisions. Make sure you: °? Wash your hands with soap and water before you change your bandage (dressing). If soap and water are not available, use hand sanitizer. °? Change your dressing as told by your health care provider. °? Leave stitches (sutures), skin glue, or adhesive strips in place. These skin closures may need to be in place for 2 weeks or longer. If adhesive strip edges start to loosen and curl up, you may trim the loose edges. Do not remove adhesive strips completely unless your health care provider tells you to do that. °· Do not take baths, swim, or use a hot tub until your health care provider approves. Ask your health care provider if you can take showers. You may only be allowed to take sponge baths for bathing. °· Check your incision area every day for signs of infection. Check for: °? More redness, swelling, or pain. °? More fluid or blood. °? Warmth. °? Pus or a bad smell. °Activity °· Do not drive or use heavy machinery while taking prescription pain medicine. °· Do not lift anything that is heavier than 10 lb (4.5 kg) until your health care provider approves. °· Do not play contact sports until your health care provider approves. °· Do not drive for 24 hours if you were given a medicine to help you relax (sedative). °· Rest as needed. Do not return to work  or school until your health care provider approves. °General instructions °· Take over-the-counter and prescription medicines only as told by your health care provider. °· To prevent or treat constipation while you are taking prescription pain medicine, your health care provider may recommend that you: °? Drink enough fluid to keep your urine clear or pale yellow. °? Take over-the-counter or prescription medicines. °? Eat foods that are high in fiber, such as fresh fruits and vegetables, whole grains, and beans. °? Limit foods that are high in fat and processed sugars, such as fried and sweet foods. °Contact a health care provider if: °· You develop a rash. °· You have more redness, swelling, or pain around your incisions. °· You have more fluid or blood coming from your incisions. °· Your incisions feel warm to the touch. °· You have pus or a bad smell coming from your incisions. °· You have a fever. °· One or more of your incisions breaks open. °Get help right away if: °· You have trouble breathing. °· You have chest pain. °· You have increasing pain in your shoulders. °· You faint or feel dizzy when you stand. °· You have severe pain in your abdomen. °· You have nausea or vomiting that lasts for more than one day. °· You have leg pain. °This information is not intended to replace advice given to you by your health care provider. Make sure you   discuss any questions you have with your health care provider. °Document Released: 06/09/2005 Document Revised: 12/29/2015 Document Reviewed: 11/26/2015 °Elsevier Interactive Patient Education © 2017 [employer] Inc[employer] ° °

## 2016-09-04 NOTE — Progress Notes (Signed)
1 Day Post-Op  Subjective: He is sore but actually looks quite good. Tolerated clear liquids well, asking for some real food. Port sites all look fine.  Objective: Vital signs in last 24 hours: Temp:  [97.7 F (36.5 C)-98.4 F (36.9 C)] 98.1 F (36.7 C) (03/15 0604) Pulse Rate:  [72-94] 72 (03/15 0604) Resp:  [14-24] 18 (03/15 0604) BP: (115-135)/(74-93) 115/83 (03/15 0604) SpO2:  [88 %-97 %] 97 % (03/15 0604) Last BM Date: 09/02/16 PO 1890 IV: 2250 Urine: 2000 Blood: 100 Afebrile vital signs are stable WBC 16,000 hemoglobin 13 hematocrit 39 No intraoperative cholangiogram Intake/Output from previous day: 03/14 0701 - 03/15 0700 In: 4140 [P.O.:1890; I.V.:2200; IV Piggyback:50] Out: 2100 [Urine:2000; Blood:100] Intake/Output this shift: No intake/output data recorded.  General appearance: alert, cooperative and no distress Resp: clear to auscultation bilaterally GI: Soft, large abdomen, few bowel sounds, sites all look fine.  Lab Results:   Recent Labs  09/03/16 0809 09/04/16 0216  WBC 14.5* 16.0*  HGB 16.6 13.1  HCT 48.3 39.0  PLT 192 206    BMET  Recent Labs  09/02/16 0843 09/02/16 1503 09/03/16 0809  NA 139  --  139  K 4.2  --  4.2  CL 105  --  106  CO2 21*  --  23  GLUCOSE 135*  --  115*  BUN 12  --  5*  CREATININE 0.99 0.76 0.98  CALCIUM 9.6  --  9.0   PT/INR No results for input(s): LABPROT, INR in the last 72 hours.   Recent Labs Lab 09/02/16 0843 09/03/16 0809  AST 24 QUANTITY NOT SUFFICIENT, UNABLE TO PERFORM TEST  ALT 45 QUANTITY NOT SUFFICIENT, UNABLE TO PERFORM TEST  ALKPHOS 78 QUANTITY NOT SUFFICIENT, UNABLE TO PERFORM TEST  BILITOT 0.4 QUANTITY NOT SUFFICIENT, UNABLE TO PERFORM TEST  PROT 6.6 6.7  ALBUMIN 4.3 4.1     Lipase     Component Value Date/Time   LIPASE QUANTITY NOT SUFFICIENT, UNABLE TO PERFORM TEST 09/03/2016 0809     Studies/Results: Dg Chest 2 View  Result Date: 09/02/2016 CLINICAL DATA:  Severe  chest/epigastric pain EXAM: CHEST  2 VIEW COMPARISON:  12/19/2013 FINDINGS: Lungs are clear.  No pleural effusion or pneumothorax. The heart is normal in size. Visualized osseous structures are within normal limits. IMPRESSION: Normal chest radiographs. Electronically Signed   By: Charline Bills M.D.   On: 09/02/2016 08:50   Ct Abdomen Pelvis W Contrast  Result Date: 09/02/2016 CLINICAL DATA:  Abdomen pain with nausea and vomit EXAM: CT ABDOMEN AND PELVIS WITH CONTRAST TECHNIQUE: Multidetector CT imaging of the abdomen and pelvis was performed using the standard protocol following bolus administration of intravenous contrast. CONTRAST:  ISOVUE-300 IOPAMIDOL (ISOVUE-300) INJECTION 61% COMPARISON:  12/27/2010, ultrasound 09/02/2016 FINDINGS: Lower chest: Patchy dependent atelectasis. No consolidation or pleural effusion. Heart size upper normal. Hepatobiliary: Mild steatosis. No focal hepatic abnormality. Slightly enlarged gallbladder contains a partially calcified gas containing stone. No wall thickening. No biliary dilatation. Pancreas: Unremarkable. No pancreatic ductal dilatation or surrounding inflammatory changes. Spleen: Normal in size without focal abnormality. Adrenals/Urinary Tract: Adrenal glands are unremarkable. Kidneys are normal, without renal calculi, focal lesion, or hydronephrosis. Bladder is unremarkable. Stomach/Bowel: Stomach is within normal limits. Appendix appears normal. No evidence of bowel wall thickening, distention, or inflammatory changes. Vascular/Lymphatic: No significant vascular findings are present. No enlarged abdominal or pelvic lymph nodes. Reproductive: Prostate contains mild calcification. No obvious mass. Other: Tiny fat in the umbilicus.  No abdominopelvic ascites. Musculoskeletal:  No acute or significant osseous findings. IMPRESSION: 1. Gallstone.  No wall thickening or biliary dilatation. 2. Mild hepatic steatosis 3. No acute intra-abdominal or pelvic  abnormality. Normal appendix. No evidence for a bowel obstruction. Electronically Signed   By: Jasmine PangKim  Fujinaga M.D.   On: 09/02/2016 15:35   [place] Abdomen Limited Ruq  Result Date: 09/02/2016 CLINICAL DATA:  Right upper quadrant pain, some nausea and vomiting over the last 24 hours EXAM: US ABDOMEN LIMITED - RIGHT UPPER QUADRANT COMPARISON:  CT abdomen pelvis of 12/27/2010 FINDINGS: Gallbladder: The gallbladder is visualized and there are gallstones present, the largest of 2.5 cm with acoustical shadowing. No gallbladder wall thickening is seen and there is no pain over the gallbladder with compression. Common bile duct: Diameter: The common bile duct measures 4.8 mm in diameter. Liver: The liver is inhomogeneous suggesting fatty infiltration but no focal hepatic abnormality is seen. IMPRESSION: 1. Gallstones, the largest of 2.5 cm. No present ultrasound evidence of acute cholecystitis is seen. 2. Inhomogeneous liver parenchyma suggesting fatty infiltration. Correlate with LFTs. Electronically Signed   By: Dwyane DeePaul  Barry M.D.   On: 09/02/2016 10:48   Prior to Admission medications   Not on File    Medications: . cefTRIAXone (ROCEPHIN)  IV  2 g Intravenous Q24H  . Chlorhexidine Gluconate Cloth  6 each Topical Daily  . mupirocin ointment  1 application Nasal BID  . ondansetron (ZOFRAN) IV  4 mg Intravenous Once  . pantoprazole (PROTONIX) IV  40 mg Intravenous Q12H   . sodium chloride 100 mL/hr at 09/04/16 0600    Assessment/Plan Acute cholecystitis/cholelithiasis. Status post laparoscopic cholecystectomy 09/03/16, Dr. Emelia LoronMatthew Wakefield. POD 1 (Some intraoperative bleeding) History of gastritis/GERD History of alcohol and tobacco use. FEN: IV fluids/clears ID: Rocephin 09/02/16 =>> day 3 - plan 5 days of postop antibiotics DVT:  SCD  Plan: Advance diet, mobilize, see how he does on oral pain medications. Continue antibiotics for another 5 days if he does well possible discharge home later this  afternoon.     LOS: 0 days    Averi Kilty 09/04/2016 [phone]

## 2016-09-30 DIAGNOSIS — R109 Unspecified abdominal pain: Secondary | ICD-10-CM | POA: Diagnosis not present

## 2016-10-02 ENCOUNTER — Ambulatory Visit: Payer: BLUE CROSS/BLUE SHIELD | Admitting: Adult Health

## 2016-10-09 ENCOUNTER — Encounter: Payer: Self-pay | Admitting: Adult Health

## 2016-10-09 ENCOUNTER — Ambulatory Visit (INDEPENDENT_AMBULATORY_CARE_PROVIDER_SITE_OTHER): Payer: BLUE CROSS/BLUE SHIELD | Admitting: Adult Health

## 2016-10-09 VITALS — BP 110/60 | Temp 98.2°F | Wt 247.0 lb

## 2016-10-09 DIAGNOSIS — Z720 Tobacco use: Secondary | ICD-10-CM | POA: Diagnosis not present

## 2016-10-09 DIAGNOSIS — Z7689 Persons encountering health services in other specified circumstances: Secondary | ICD-10-CM | POA: Diagnosis not present

## 2016-10-09 DIAGNOSIS — M1A0710 Idiopathic chronic gout, right ankle and foot, without tophus (tophi): Secondary | ICD-10-CM | POA: Diagnosis not present

## 2016-10-09 MED ORDER — ALLOPURINOL 100 MG PO TABS: 100.0000 mg | ORAL_TABLET | Freq: Every day | ORAL | 3 refills | Status: DC

## 2016-10-09 MED ORDER — VARENICLINE TARTRATE 1 MG PO TABS: 1.0000 mg | ORAL_TABLET | Freq: Two times a day (BID) | ORAL | 3 refills | Status: DC

## 2016-10-09 MED ORDER — VARENICLINE TARTRATE 0.5 MG X 11 & 1 MG X 42 PO MISC: ORAL | 0 refills | Status: DC

## 2016-10-09 NOTE — Patient Instructions (Signed)
It was great meeting you today   Congratulations on wanting to quit smoking. I have prescribed Chantix for you to use.   Please follow up with me in one month for your physical and we can see how you are doing on Chantix at that time.   I have sent in a prescription for Allopurinol for gout - Take this daily.

## 2016-10-09 NOTE — Progress Notes (Signed)
Patient presents to clinic today to establish care. He is a pleasant 41 year old male who  has a past medical history of GASTRITIS (03/21/2010); GERD (03/21/2010); RCBULAGT(364.6) (01/10/2009); LATERAL EPICONDYLITIS OF ELBOW (08/03/2009); SHOULDER PAIN (01/23/2010); and VIRAL INFECTION (08/03/2009).   Acute Concerns:  Establish Care   Chronic Issues: Cholelithiasis with acute cholecystitis - recently done 09/02/2016. He denies any complaints with the surgery. Has started to eat healthy after he was discharged.   Tobacco Use - He would like to quit smoking. He smokes about three packs of day. He has a 45 pack year   Gout - has multiple episodes of gout over the year. He understands this is due to his diet, he eats a lot of shellfish. He is unwilling to give this up.   Health Maintenance: Dental -- Does not do routine care Vision -- Routine Care Immunizations -- Believes he has had a tdap  Colonoscopy -- Never had  Diet: He is currently on a diet  Exercise: Stays active in Architect work   Past Medical History:  Diagnosis Date  . GASTRITIS 03/21/2010   Qualifier: Diagnosis of  By: Nelson-Smith CMA (AAMA), Dottie    . GERD 03/21/2010   Qualifier: Diagnosis of  By: Nelson-Smith CMA (AAMA), Dottie    . Headache(784.0) 01/10/2009   Qualifier: Diagnosis of  By: Valma Cava LPN, Izora Gala    . LATERAL EPICONDYLITIS OF ELBOW 08/03/2009   Qualifier: Diagnosis of  By: Elease Hashimoto MD, Bruce    . SHOULDER PAIN 01/23/2010   Qualifier: Diagnosis of  By: Valma Cava LPN, Izora Gala    . VIRAL INFECTION 08/03/2009   Qualifier: Diagnosis of  By: Elease Hashimoto MD, Bruce      Past Surgical History:  Procedure Laterality Date  . CHOLECYSTECTOMY N/A 09/03/2016   Procedure: LAPAROSCOPIC CHOLECYSTECTOMY;  Surgeon: Rolm Bookbinder, MD;  Location: [facility];  Service: General;  Laterality: N/A;  . TONSILLECTOMY      No current outpatient prescriptions on file prior to visit.   No current facility-administered medications on  file prior to visit.     Allergies  Allergen Reactions  . Azithromycin Rash    Family History  Problem Relation Age of Onset  . Crohn's disease Other     Social History   Social History  . Marital status: Married    Spouse name: N/A  . Number of children: N/A  . Years of education: N/A   Occupational History  . Not on file.   Social History Main Topics  . Smoking status: Current Every Day Smoker    Types: Cigarettes  . Smokeless tobacco: Never Used  . Alcohol use 12.0 oz/week    20 Shots of liquor per week     Comment: states goes through a 1/2 gallon a week (vodka and redbull)  . Drug use: No  . Sexual activity: Not on file   Other Topics Concern  . Not on file   Social History Narrative  . No narrative on file    Review of Systems  Constitutional: Negative.   HENT: Negative.   Eyes: Negative.   Respiratory: Negative.   Cardiovascular: Negative.   Gastrointestinal: Negative.   Genitourinary: Negative.   Musculoskeletal: Negative.   Skin: Negative.   Neurological: Negative.   Psychiatric/Behavioral: Negative.   All other systems reviewed and are negative.   BP 110/60 (BP Location: Left Arm, Patient Position: Sitting, Cuff Size: Large)   Temp 98.2 F (36.8 C) (Oral)   Wt 247 lb (112 kg)  BMI 32.59 kg/m   Physical Exam  Constitutional: He is oriented to person, place, and time and well-developed, well-nourished, and in no distress. No distress.  Overweight   Neck: Normal range of motion. Neck supple.  Cardiovascular: Normal rate, regular rhythm, normal heart sounds and intact distal pulses.  Exam reveals no gallop and no friction rub.   No murmur heard. Pulmonary/Chest: Effort normal and breath sounds normal. No respiratory distress. He has no wheezes. He has no rales. He exhibits no tenderness.  Abdominal: Soft. Bowel sounds are normal. He exhibits no distension and no mass. There is no tenderness. There is no rebound and no guarding.  Well  healing surgical scars   Musculoskeletal: Normal range of motion. He exhibits no edema, tenderness or deformity.  Lymphadenopathy:    He has no cervical adenopathy.  Neurological: He is alert and oriented to person, place, and time. Gait normal. GCS score is 15.  Skin: Skin is warm and dry. No rash noted. He is not diaphoretic. No erythema. No pallor.  Psychiatric: Memory, affect and judgment normal.  Nursing note and vitals reviewed.   Recent Results (from the past 2160 hour(s))  CBC     Status: Abnormal   Collection Time: 09/02/16  8:43 AM  Result Value Ref Range   WBC 17.7 (H) 4.0 - 10.5 K/uL   RBC 5.06 4.22 - 5.81 MIL/uL   Hemoglobin 15.5 13.0 - 17.0 g/dL   HCT 45.0 39.0 - 52.0 %   MCV 88.9 78.0 - 100.0 fL   MCH 30.6 26.0 - 34.0 pg   MCHC 34.4 30.0 - 36.0 g/dL   RDW 13.5 11.5 - 15.5 %   Platelets 237 150 - 400 K/uL  Comprehensive metabolic panel     Status: Abnormal   Collection Time: 09/02/16  8:43 AM  Result Value Ref Range   Sodium 139 135 - 145 mmol/L   Potassium 4.2 3.5 - 5.1 mmol/L   Chloride 105 101 - 111 mmol/L   CO2 21 (L) 22 - 32 mmol/L   Glucose, Bld 135 (H) 65 - 99 mg/dL   BUN 12 6 - 20 mg/dL   Creatinine, Ser 0.99 0.61 - 1.24 mg/dL   Calcium 9.6 8.9 - 10.3 mg/dL   Total Protein 6.6 6.5 - 8.1 g/dL   Albumin 4.3 3.5 - 5.0 g/dL   AST 24 15 - 41 U/L   ALT 45 17 - 63 U/L   Alkaline Phosphatase 78 38 - 126 U/L   Total Bilirubin 0.4 0.3 - 1.2 mg/dL   GFR calc non Af Amer >60 >60 mL/min   GFR calc Af Amer >60 >60 mL/min    Comment: (NOTE) The eGFR has been calculated using the CKD EPI equation. This calculation has not been validated in all clinical situations. eGFR's persistently <60 mL/min signify possible Chronic Kidney Disease.    Anion gap 13 5 - 15  Lipase, blood     Status: Abnormal   Collection Time: 09/02/16  8:43 AM  Result Value Ref Range   Lipase <10 (L) 11 - 51 U/L    Comment: RESULT REPEATED AND VERIFIED  I-stat troponin, ED     Status:  None   Collection Time: 09/02/16  8:50 AM  Result Value Ref Range   Troponin i, poc 0.00 0.00 - 0.08 ng/mL   Comment 3            Comment: Due to the release kinetics of cTnI, a negative result within the first hours  of the onset of symptoms does not rule out myocardial infarction with certainty. If myocardial infarction is still suspected, repeat the test at appropriate intervals.   HIV antibody (Routine Testing)     Status: None   Collection Time: 09/02/16  3:03 PM  Result Value Ref Range   HIV Screen 4th Generation wRfx Non Reactive Non Reactive    Comment: (NOTE) Performed At: [facility] [place], [place] [id number] Lindon Romp MD SA:[id number]   CBC     Status: Abnormal   Collection Time: 09/02/16  3:03 PM  Result Value Ref Range   WBC 16.0 (H) 4.0 - 10.5 K/uL   RBC 5.06 4.22 - 5.81 MIL/uL   Hemoglobin 15.3 13.0 - 17.0 g/dL   HCT 45.2 39.0 - 52.0 %   MCV 89.3 78.0 - 100.0 fL   MCH 30.2 26.0 - 34.0 pg   MCHC 33.8 30.0 - 36.0 g/dL   RDW 13.6 11.5 - 15.5 %   Platelets 250 150 - 400 K/uL  Creatinine, serum     Status: None   Collection Time: 09/02/16  3:03 PM  Result Value Ref Range   Creatinine, Ser 0.76 0.61 - 1.24 mg/dL   GFR calc non Af Amer >60 >60 mL/min   GFR calc Af Amer >60 >60 mL/min    Comment: (NOTE) The eGFR has been calculated using the CKD EPI equation. This calculation has not been validated in all clinical situations. eGFR's persistently <60 mL/min signify possible Chronic Kidney Disease.   Surgical pcr screen     Status: Abnormal   Collection Time: 09/02/16 11:19 PM  Result Value Ref Range   MRSA, PCR NEGATIVE NEGATIVE   Staphylococcus aureus POSITIVE (A) NEGATIVE    Comment:        The Xpert SA Assay (FDA approved for NASAL specimens in patients over 58 years of age), is one component of a comprehensive surveillance program.  Test performance has been validated by [facility] for patients greater than or equal  to 75 year old. It is not intended to diagnose infection nor to guide or monitor treatment.   Comprehensive metabolic panel     Status: Abnormal   Collection Time: 09/03/16  8:09 AM  Result Value Ref Range   Sodium 139 135 - 145 mmol/L   Potassium 4.2 3.5 - 5.1 mmol/L   Chloride 106 101 - 111 mmol/L   CO2 23 22 - 32 mmol/L   Glucose, Bld 115 (H) 65 - 99 mg/dL   BUN 5 (L) 6 - 20 mg/dL   Creatinine, Ser 0.98 0.61 - 1.24 mg/dL   Calcium 9.0 8.9 - 10.3 mg/dL   Total Protein 6.7 6.5 - 8.1 g/dL   Albumin 4.1 3.5 - 5.0 g/dL   AST QUANTITY NOT SUFFICIENT, UNABLE TO PERFORM TEST 15 - 41 U/L   ALT QUANTITY NOT SUFFICIENT, UNABLE TO PERFORM TEST 17 - 63 U/L   Alkaline Phosphatase QUANTITY NOT SUFFICIENT, UNABLE TO PERFORM TEST 38 - 126 U/L   Total Bilirubin QUANTITY NOT SUFFICIENT, UNABLE TO PERFORM TEST 0.3 - 1.2 mg/dL    Comment: B ELLIEOTT,RN [id number] WILDERK   GFR calc non Af Amer >60 >60 mL/min   GFR calc Af Amer >60 >60 mL/min    Comment: (NOTE) The eGFR has been calculated using the CKD EPI equation. This calculation has not been validated in all clinical situations. eGFR's persistently <60 mL/min signify possible Chronic Kidney Disease.    Anion gap 10  5 - 15  CBC     Status: Abnormal   Collection Time: 09/03/16  8:09 AM  Result Value Ref Range   WBC 14.5 (H) 4.0 - 10.5 K/uL   RBC 5.40 4.22 - 5.81 MIL/uL   Hemoglobin 16.6 13.0 - 17.0 g/dL   HCT 48.3 39.0 - 52.0 %   MCV 89.4 78.0 - 100.0 fL   MCH 30.7 26.0 - 34.0 pg   MCHC 34.4 30.0 - 36.0 g/dL   RDW 14.0 11.5 - 15.5 %   Platelets 192 150 - 400 K/uL  Lipase, blood     Status: None   Collection Time: 09/03/16  8:09 AM  Result Value Ref Range   Lipase QUANTITY NOT SUFFICIENT, UNABLE TO PERFORM TEST 11 - 51 U/L  CBC     Status: Abnormal   Collection Time: 09/04/16  2:16 AM  Result Value Ref Range   WBC 16.0 (H) 4.0 - 10.5 K/uL   RBC 4.33 4.22 - 5.81 MIL/uL   Hemoglobin 13.1 13.0 - 17.0 g/dL    Comment: DELTA CHECK  NOTED REPEATED TO VERIFY    HCT 39.0 39.0 - 52.0 %   MCV 90.1 78.0 - 100.0 fL   MCH 30.3 26.0 - 34.0 pg   MCHC 33.6 30.0 - 36.0 g/dL   RDW 13.4 11.5 - 15.5 %   Platelets 206 150 - 400 K/uL    Assessment/Plan: 1. Encounter to establish care - Follow up for CPE  - Encouraged heart healthy diet and exercise  2. Chronic gout of right foot, unspecified cause  - allopurinol (ZYLOPRIM) 100 MG tablet; Take 1 tablet (100 mg total) by mouth daily.  Dispense: 90 tablet; Refill: 3  3. Tobacco use Trial of chantix. Common side effects including rare risk of suicide ideation was discussed with the patient today.  Patient is instructed to go directly to the ED if this occurs.  We discussed that patient can continue to smoke for 2 weeks after starting chantix, but then must discontinue cigarettes.  He is to follow up in one month after starting Chantix  5 minutes spent with patient today on tobacco cessation counseling.   - varenicline (CHANTIX CONTINUING MONTH PAK) 1 MG tablet; Take 1 tablet (1 mg total) by mouth 2 (two) times daily.  Dispense: 60 tablet; Refill: 3 - varenicline (CHANTIX STARTING MONTH PAK) 0.5 MG X 11 & 1 MG X 42 tablet; Take one 0.5 mg tablet by mouth once daily for 3 days, then increase to one 0.5 mg tablet twice daily for 4 days, then increase to one 1 mg tablet twice daily.  Dispense: 53 tablet; Refill: 0   Dorothyann Peng, NP

## 2016-11-22 NOTE — Addendum Note (Signed)
Addendum  created 11/22/16 1045 by Henlee Donovan, MD   Sign clinical note    

## 2016-11-26 ENCOUNTER — Ambulatory Visit: Payer: BLUE CROSS/BLUE SHIELD | Admitting: Adult Health

## 2016-11-26 DIAGNOSIS — M109 Gout, unspecified: Secondary | ICD-10-CM | POA: Insufficient documentation

## 2016-11-26 DIAGNOSIS — F17200 Nicotine dependence, unspecified, uncomplicated: Secondary | ICD-10-CM | POA: Insufficient documentation

## 2016-11-26 NOTE — Progress Notes (Deleted)
Subjective:    Patient ID: Jonathan Vaughn, male    DOB: 09/20/1975, 41 y.o.   MRN: [id number]  HPI  Patient presents for yearly preventative medicine examination. 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  All immunizations and health maintenance protocols were reviewed with the patient and needed orders were placed.  Appropriate screening laboratory values were ordered for the patient including screening of hyperlipidemia, renal function and hepatic function. If indicated by BPH, a PSA was ordered.  Medication reconciliation,  past medical history, social history, problem list and allergies were reviewed in detail with the patient  Goals were established with regard to weight loss, exercise, and  diet in compliance with medications  He was started on Chantix one month ago and reports that ***  He has a history of gout and was started on Allopurinol 100mg  one month ago during his initial visit   He is not up to date on his dental visits but he does do routine vision care.    Review of Systems  Constitutional: Negative.   HENT: Negative.   Eyes: Negative.   Respiratory: Negative.   Cardiovascular: Negative.   Gastrointestinal: Negative.   Endocrine: Negative.   Genitourinary: Negative.   Musculoskeletal: Negative.   Skin: Negative.   Allergic/Immunologic: Negative.   Neurological: Negative.   Hematological: Negative.   Psychiatric/Behavioral: Negative.   All other systems reviewed and are negative.  Past Medical History:  Diagnosis Date  . 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 GERD 03/21/2010   Qualifier: Diagnosis of  By: Nelson-Smith CMA (AAMA), Dottie    . Gout   . Headache(784.0) 01/10/2009   Qualifier: Diagnosis of  By: Gabriel RungNeckers LPN, Harriett SineNancy    . LATERAL EPICONDYLITIS  OF ELBOW 08/03/2009   Qualifier: Diagnosis of  By: Caryl NeverBurchette MD, Bruce    . SHOULDER PAIN 01/23/2010   Qualifier: Diagnosis of  By: Gabriel RungNeckers LPN, Harriett SineNancy      Social History   Social History  . Marital status: Married    Spouse name: N/A  . Number of children: N/A  . Years of education: N/A   Occupational History  . Not on file.   Social History Main Topics  . Smoking status: Current Every Day Smoker    Packs/day: 3.00    Years: 15.00    Types: Cigarettes  . Smokeless tobacco: Never Used  . Alcohol use 12.0 oz/week    20 Shots of liquor per week     Comment: states goes through a 1/2 gallon a week (vodka and redbull)  . Drug use: No  . Sexual activity: Not on file   Other Topics Concern  . 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 CHOLECYSTECTOMY N/A 09/03/2016   Procedure: LAPAROSCOPIC CHOLECYSTECTOMY;  Surgeon: Emelia LoronMatthew Wakefield, MD;  [facility];  Service: General;  Laterality: N/A;  . TONSILLECTOMY      Family History  Problem Relation Age of Onset  . Crohn's disease Other   . Gout Father     Allergies  Allergen Reactions  . Azithromycin Rash    Current Outpatient Prescriptions on File Prior  to Visit  Medication Sig Dispense Refill  . allopurinol (ZYLOPRIM) 100 MG tablet Take 1 tablet (100 mg total) by mouth daily. 90 tablet 3  . varenicline (CHANTIX CONTINUING MONTH PAK) 1 MG tablet Take 1 tablet (1 mg total) by mouth 2 (two) times daily. 60 tablet 3  . varenicline (CHANTIX STARTING MONTH PAK) 0.5 MG X 11 & 1 MG X 42 tablet Take one 0.5 mg tablet by mouth once daily for 3 days, then increase to one 0.5 mg tablet twice daily for 4 days, then increase to one 1 mg tablet twice daily. 53 tablet 0   No current facility-administered medications on file prior to visit.     There were no vitals taken for this visit.        Objective:   Physical Exam  Constitutional: He is oriented to person, place, and time. He appears well-developed and well-nourished. No distress.  HENT:  Head: Normocephalic and atraumatic.  Right Ear: External ear normal.  Left Ear: External ear normal.  Nose: Nose normal.  Mouth/Throat: Oropharynx is clear and moist. No oropharyngeal exudate.  Eyes: Conjunctivae and EOM are normal. Pupils are equal, round, and reactive to light. Right eye exhibits no discharge. Left eye exhibits no discharge. No scleral icterus.  Neck: Normal range of motion. Neck supple. No tracheal deviation present. No thyromegaly present.  Cardiovascular: Normal rate, regular rhythm, normal heart sounds and intact distal pulses.  Exam reveals no gallop and no friction rub.   No murmur heard. Pulmonary/Chest: Effort normal and breath sounds normal. No respiratory distress. He has no wheezes. He has no rales. He exhibits no tenderness.  Abdominal: Soft. Bowel sounds are normal. He exhibits no distension and no mass. There is no tenderness. There is no rebound and no guarding.  Musculoskeletal: Normal range of motion. He exhibits no edema, tenderness or deformity.  Lymphadenopathy:    He has no cervical adenopathy.  Neurological: He is alert and oriented to person, place, and time. He has normal reflexes. He displays normal reflexes. No cranial nerve deficit. He exhibits normal muscle tone. Coordination normal.  Skin: Skin is warm and dry. No rash noted. He is not diaphoretic. No erythema. No pallor.  Psychiatric: He has a normal mood and affect. His behavior is normal. Judgment and thought content normal.  Nursing note and vitals reviewed.     Assessment & Plan:

## 2016-12-03 ENCOUNTER — Ambulatory Visit (INDEPENDENT_AMBULATORY_CARE_PROVIDER_SITE_OTHER): Payer: BLUE CROSS/BLUE SHIELD | Admitting: Adult Health

## 2016-12-03 ENCOUNTER — Encounter: Payer: Self-pay | Admitting: Adult Health

## 2016-12-03 VITALS — BP 124/78 | Temp 98.6°F | Ht 73.0 in | Wt 240.4 lb

## 2016-12-03 DIAGNOSIS — Z720 Tobacco use: Secondary | ICD-10-CM

## 2016-12-03 NOTE — Progress Notes (Signed)
Subjective:    Patient ID: Jonathan Vaughn, male    DOB: [birth year], 41 y.o.   MRN: [id number]  HPI  41 year old male who presents to the office regarding quitting smoking. During the last visit we had started him on Chantix. He reports that he was having vivid dreams and had to stop the medication. Per patient " I woke up and had to ask my wife to help me get out of trouble because I had killed two people."   He would like to discuss other options.   He has been working on cutting back the amount of cigarettes he smokes throughout the day   Review of Systems See HPI   Past Medical History:  Diagnosis Date  . 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 GERD 03/21/2010   Qualifier: Diagnosis of  By: Nelson-Smith CMA (AAMA), Dottie    . Gout   . Headache(784.0) 01/10/2009   Qualifier: Diagnosis of  By: Gabriel Rung LPN, Harriett Sine    . LATERAL EPICONDYLITIS OF ELBOW 08/03/2009   Qualifier: Diagnosis of  By: Caryl Never MD, Bruce    . SHOULDER PAIN 01/23/2010   Qualifier: Diagnosis of  By: Gabriel Rung LPN, Harriett Sine      Social History   Social History  . Marital status: Married    Spouse name: N/A  . Number of children: N/A  . Years of education: N/A   Occupational History  . Not on file.   Social History Main Topics  . Smoking status: Current Every Day Smoker    Packs/day: 3.00    Years: 15.00    Types: Cigarettes  . Smokeless tobacco: Never Used  . Alcohol use 12.0 oz/week    20 Shots of liquor per week     Comment: states goes through a 1/2 gallon a week (vodka and redbull)  . Drug use: No  . Sexual activity: Not on file   Other Topics Concern  . Not on file   Social History Narrative   He is self employed - Corporate investment banker    Married    Has children           Past Surgical History:  Procedure Laterality Date  . CHOLECYSTECTOMY N/A 09/03/2016   Procedure: LAPAROSCOPIC CHOLECYSTECTOMY;  Surgeon: Emelia Loron, MD;  Location: [facility] OR;  Service: General;  Laterality:  N/A;  . TONSILLECTOMY      Family History  Problem Relation Age of Onset  . Crohn's disease Other   . Gout Father     Allergies  Allergen Reactions  . Azithromycin Rash    Current Outpatient Prescriptions on File Prior to Visit  Medication Sig Dispense Refill  . allopurinol (ZYLOPRIM) 100 MG tablet Take 1 tablet (100 mg total) by mouth daily. 90 tablet 3   No current facility-administered medications on file prior to visit.     BP 124/78 (BP Location: Left Arm, Patient Position: Sitting, Cuff Size: Normal)   Temp 98.6 F (37 C) (Oral)   Ht 6\' 1"  (1.854 m)   Wt 240 lb 6.4 oz (109 kg)   BMI 31.72 kg/m       Objective:   Physical Exam  Constitutional: He is oriented to person, place, and time. He appears well-developed and well-nourished. No distress.  Cardiovascular: Normal rate, regular rhythm, normal heart sounds and intact distal pulses.  Exam reveals no gallop and no friction rub.   No murmur heard. Pulmonary/Chest: Effort normal and breath sounds normal. No respiratory distress. He has  no wheezes. He has no rales. He exhibits no tenderness.  Neurological: He is alert and oriented to person, place, and time.  Skin: Skin is warm and dry. No rash noted. He is not diaphoretic. No erythema. No pallor.  Psychiatric: He has a normal mood and affect. His behavior is normal. Judgment and thought content normal.  Nursing note and vitals reviewed.     Assessment & Plan:  1. Tobacco use - We talked about other options including the patch, gum, and wellbutrin. He would like to try the patch first.  - He is going to make his physical exam for August. We can assess him then   Shirline Freesory Jaque Dacy, NP

## 2017-01-21 ENCOUNTER — Encounter: Payer: BLUE CROSS/BLUE SHIELD | Admitting: Adult Health

## 2017-01-21 DIAGNOSIS — Z0289 Encounter for other administrative examinations: Secondary | ICD-10-CM

## 2017-01-21 NOTE — Progress Notes (Deleted)
Subjective:    Patient ID: Jonathan Vaughn, male    DOB: 11/07/1975, 41 y.o.   MRN: [id number]  HPI  Patient presents for yearly preventative medicine examination. 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   All immunizations and health maintenance protocols were reviewed with the patient and needed orders were placed.  Appropriate screening laboratory values were ordered for the patient including screening of hyperlipidemia, renal function and hepatic function. If indicated by BPH, a PSA was ordered.  Medication reconciliation,  past medical history, social history, problem list and allergies were reviewed in detail with the patient  Goals were established with regard to weight loss, exercise, and  diet in compliance with medications  End of life planning was discussed.  Gout - has multiple episodes of gout over the year. He understands this is due to his diet, he eats a lot of shellfish. He is unwilling to give this up. He takes Allopurinol 100mg  daily.   Tobacco Use - Has used Chantix in the past but had vivid violent dreams while taking it. He has been using the patch and has found that   He does not do routine dental care but does do routine vision exams.    Review of Systems  Constitutional: Negative.   HENT: Negative.   Eyes: Negative.   Respiratory: Negative.   Cardiovascular: Negative.   Gastrointestinal: Negative.   Endocrine: Negative.   Genitourinary: Negative.   Musculoskeletal: Negative.   Skin: Negative.   Allergic/Immunologic: Negative.   Neurological: Negative.   Hematological: Negative.   Psychiatric/Behavioral: Negative.   All other systems reviewed and are negative.  Past Medical History:  Diagnosis Date  . 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 GERD  03/21/2010   Qualifier: Diagnosis of  By: Nelson-Smith CMA (AAMA), Dottie    . Gout   . Headache(784.0) 01/10/2009   Qualifier: Diagnosis of  By: Gabriel RungNeckers LPN, Harriett SineNancy    . LATERAL EPICONDYLITIS OF ELBOW 08/03/2009   Qualifier: Diagnosis of  By: Caryl NeverBurchette MD, Bruce    . SHOULDER PAIN 01/23/2010   Qualifier: Diagnosis of  By: Gabriel RungNeckers LPN, Harriett SineNancy      Social History   Social History  . Marital status: Married    Spouse name: N/A  . Number of children: N/A  . Years of education: N/A   Occupational History  . Not on file.   Social History Main Topics  . Smoking status: Current Every Day Smoker    Packs/day: 3.00    Years: 15.00    Types: Cigarettes  . Smokeless tobacco: Never Used  . Alcohol use 12.0 oz/week    20 Shots of liquor per week     Comment: states goes through a 1/2 gallon a week (vodka and redbull)  . Drug use: No  . Sexual activity: Not on file   Other Topics Concern  . 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 CHOLECYSTECTOMY N/A 09/03/2016   Procedure: LAPAROSCOPIC CHOLECYSTECTOMY;  Surgeon: Emelia LoronMatthew Wakefield, MD;  [facility];  Service: General;  Laterality: N/A;  . TONSILLECTOMY      Family History  Problem Relation Age of Onset  . Crohn's disease Other   . Gout Father     Allergies  Allergen Reactions  . Azithromycin Rash    Current Outpatient Prescriptions on File Prior to Visit  Medication Sig Dispense Refill  . allopurinol (ZYLOPRIM) 100 MG tablet Take 1 tablet (100 mg total) by mouth daily. 90 tablet 3   No current facility-administered medications on file prior to visit.     There were no vitals taken for this visit.      Objective:   Physical Exam  Constitutional: He is oriented to person, place, and time. He appears well-developed and well-nourished. No distress.  HENT:  Head: Normocephalic and  atraumatic.  Right Ear: External ear normal.  Left Ear: External ear normal.  Nose: Nose normal.  Mouth/Throat: Oropharynx is clear and moist. No oropharyngeal exudate.  Eyes: Pupils are equal, round, and reactive to light. Conjunctivae are normal. Right eye exhibits no discharge. Left eye exhibits no discharge. No scleral icterus.  Neck: Normal range of motion. Neck supple. No JVD present. No tracheal deviation present. No thyromegaly present.  Cardiovascular: Normal rate, regular rhythm, normal heart sounds and intact distal pulses.  Exam reveals no gallop and no friction rub.   No murmur heard. Pulmonary/Chest: Effort normal and breath sounds normal. No stridor. No respiratory distress. He has no wheezes. He has no rales. He exhibits no tenderness.  Abdominal: Soft. Bowel sounds are normal. He exhibits no distension and no mass. There is no tenderness. There is no rebound and no guarding.  Musculoskeletal: Normal range of motion. He exhibits no edema, tenderness or deformity.  Lymphadenopathy:    He has no cervical adenopathy.  Neurological: He is alert and oriented to person, place, and time. He has normal reflexes. He displays normal reflexes. No cranial nerve deficit. He exhibits normal muscle tone. Coordination normal.  Skin: Skin is warm and dry. No rash noted. He is not diaphoretic. No erythema. No pallor.  Psychiatric: He has a normal mood and affect. His behavior is normal. Judgment and thought content normal.  Nursing note and vitals reviewed.     Assessment & Plan:

## 2018-01-18 IMAGING — US US ABDOMEN LIMITED
1 series · 14 of 25 positions shown · non-contrast
Comparison: CT abdomen pelvis of 12/27/2010

CLINICAL DATA: Right upper quadrant pain, some nausea and vomiting
over the last 24 hours

EXAM:
US ABDOMEN LIMITED - RIGHT UPPER QUADRANT

[Series 1: us abdomen limited · 0.31mm/px · 14 of 44 slices shown]
[im 1/44]
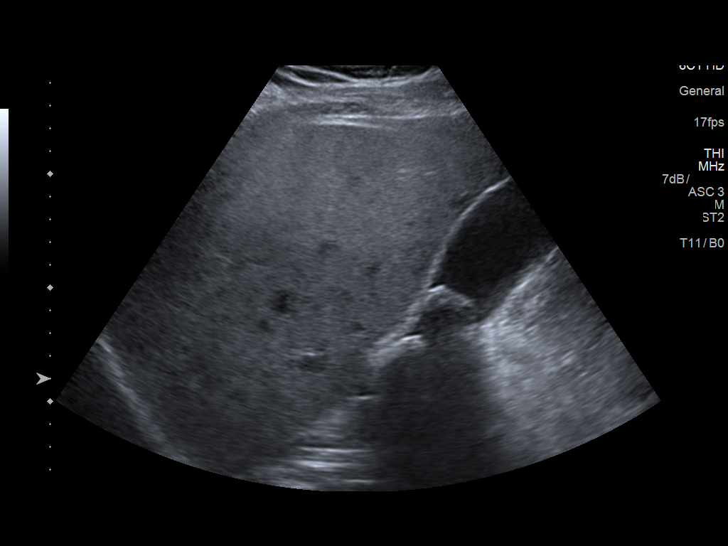
[im 4/44]
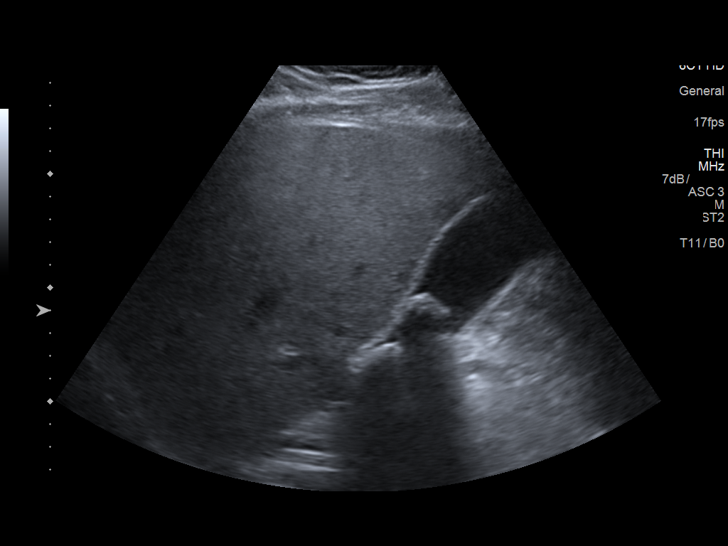
[im 8/44]
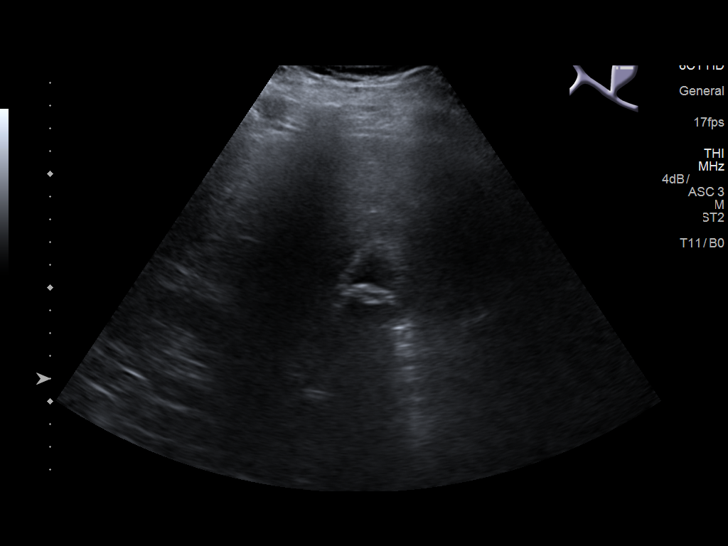
[im 11/44]
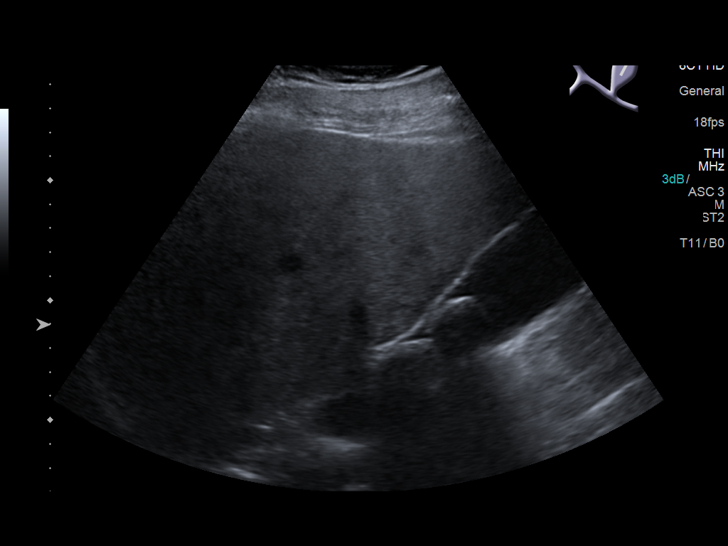
[im 15/44]
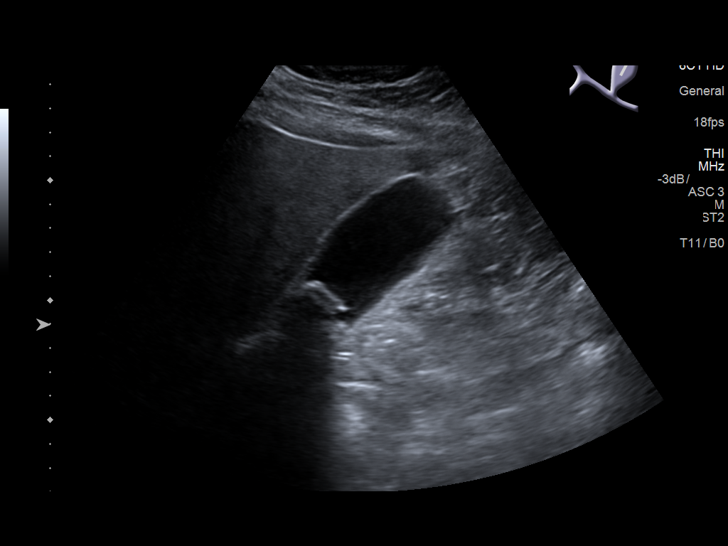
[im 17/44]
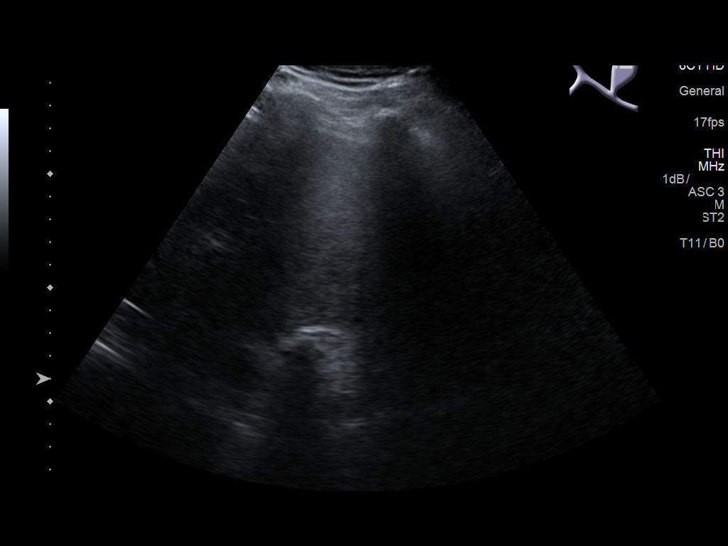
[im 20/44]
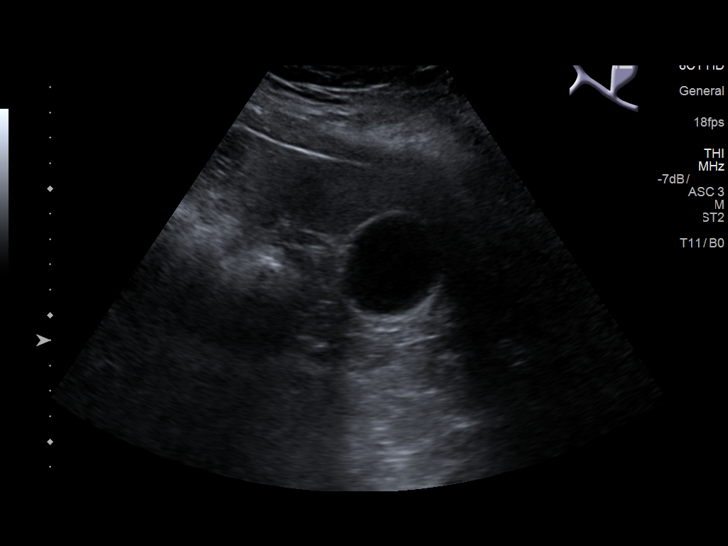
[im 24/44]
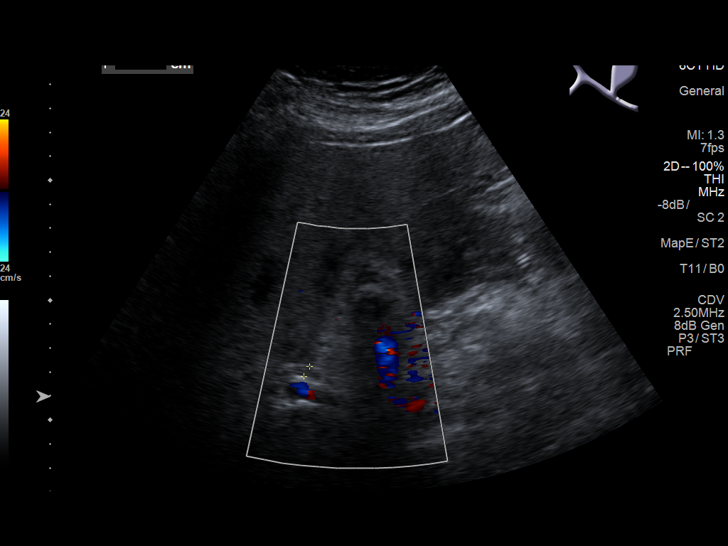
[im 27/44]
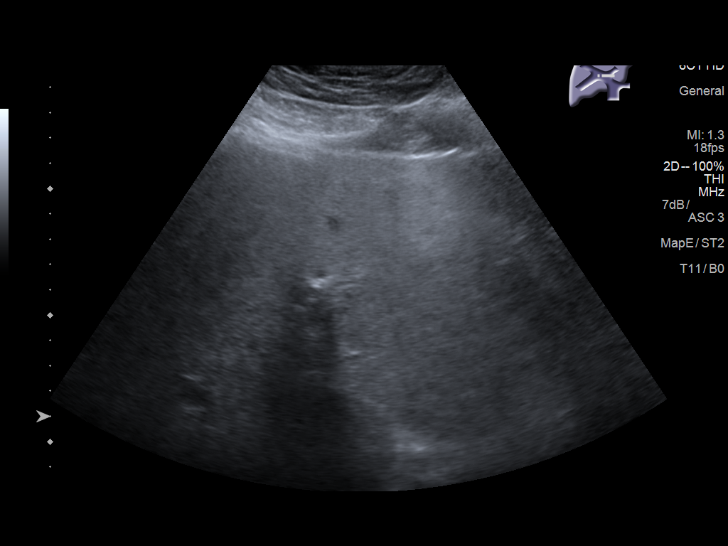
[im 29/44]
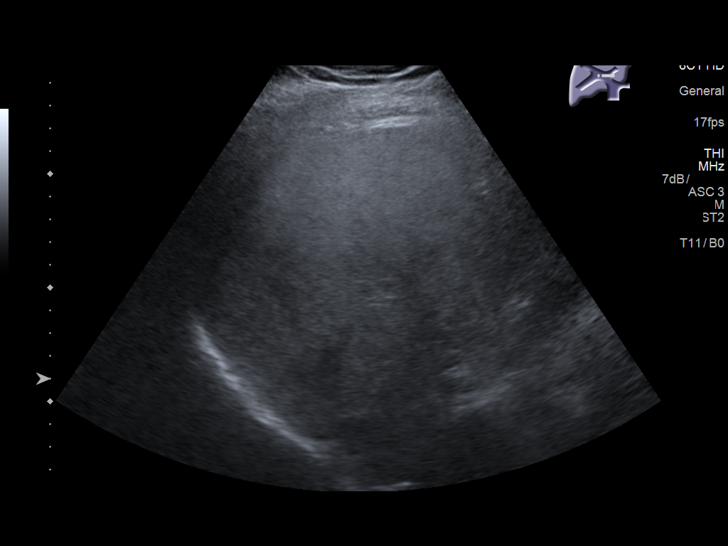
[im 33/44]
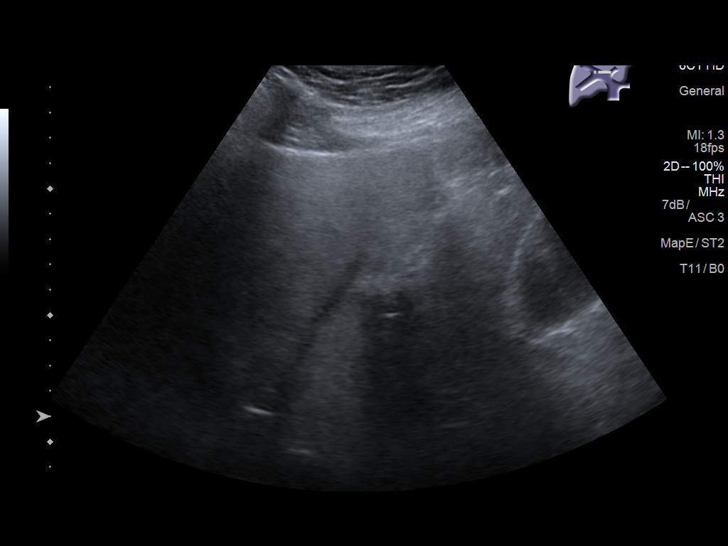
[im 36/44]
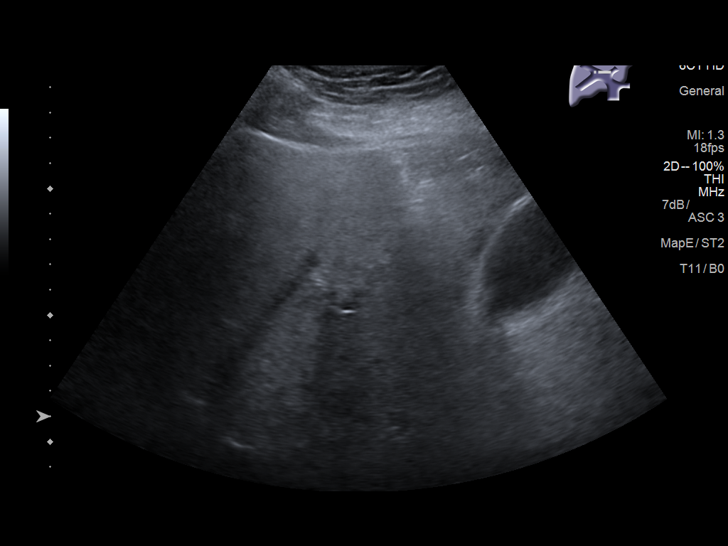
[im 40/44]
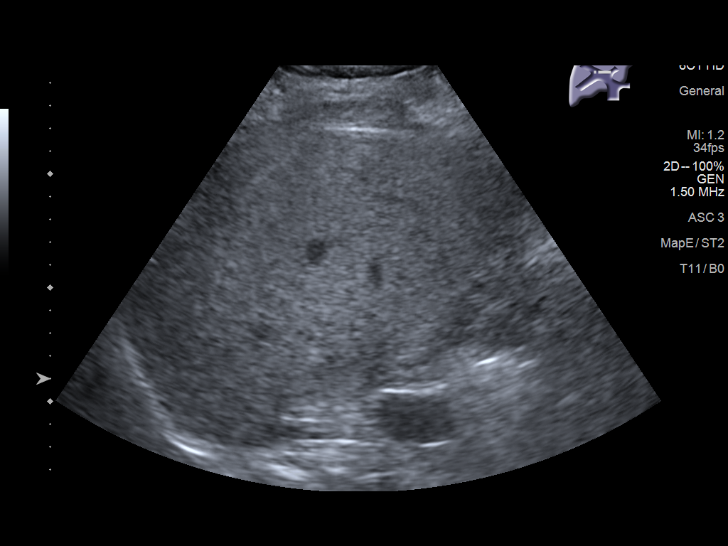
[im 44/44]
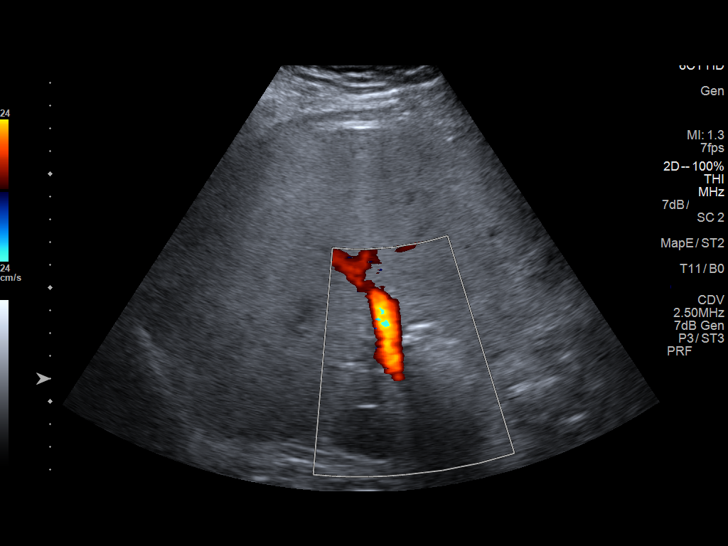

[14 of 25 positions shown; findings below may reference images not displayed]

FINDINGS: Gallbladder:

The gallbladder is visualized and there are gallstones present, the
largest of 2.5 cm with acoustical shadowing. No gallbladder wall
thickening is seen and there is no pain over the gallbladder with
compression.

Common bile duct:

Diameter: The common bile duct measures 4.8 mm in diameter.

Liver:

The liver is inhomogeneous suggesting fatty infiltration but no
focal hepatic abnormality is seen.
IMPRESSION: 1. Gallstones, the largest of 2.5 cm. No present ultrasound evidence
of acute cholecystitis is seen.
2. Inhomogeneous liver parenchyma suggesting fatty infiltration.
Correlate with LFTs.

## 2018-02-19 ENCOUNTER — Encounter: Payer: Self-pay | Admitting: Internal Medicine

## 2018-02-19 ENCOUNTER — Ambulatory Visit: Payer: BLUE CROSS/BLUE SHIELD | Admitting: Internal Medicine

## 2018-02-19 VITALS — BP 122/66 | HR 83 | Temp 97.9°F | Wt 235.0 lb

## 2018-02-19 DIAGNOSIS — R1033 Periumbilical pain: Secondary | ICD-10-CM

## 2018-02-19 DIAGNOSIS — K429 Umbilical hernia without obstruction or gangrene: Secondary | ICD-10-CM | POA: Diagnosis not present

## 2018-02-19 NOTE — Progress Notes (Signed)
Chief Complaint  Patient presents with  . Hernia    Pt noticed hernia around his belly button about 1 week ago. pt states that it started with a sharp pain and abnormal bulging in belly button region. Pt reports that he picked up 3 rolls of wire about 200lbs a piece but did not recall injuring hemself when he picked them up. Regular BMs but seems to have some relief in belly after having BM    HPI: Jonathan Vaughn 42 y.o. 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  No blood   Laying on back to sleep to avoid a problem . Tender but no swelling now   last seen over a year ago  For tobacco  Use  He stopped on its own.  ROS: See pertinent positives and negatives per HPI. Had lap choley  3 18 Past Medical History:  Diagnosis Date  . 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 GERD 03/21/2010   Qualifier: Diagnosis of  By: Nelson-Smith CMA (AAMA), Dottie    . Gout   . Headache(784.0) 01/10/2009   Qualifier: Diagnosis of  By: Gabriel RungNeckers LPN, Harriett SineNancy    . LATERAL EPICONDYLITIS OF ELBOW 08/03/2009   Qualifier: Diagnosis of  By: Caryl NeverBurchette MD, Bruce    . SHOULDER PAIN 01/23/2010   Qualifier: Diagnosis of  By: Gabriel RungNeckers LPN, Harriett SineNancy      Family History  Problem Relation Age of Onset  . Crohn's disease Other   . Gout Father     Social History   Socioeconomic History  . Marital status: Married    Spouse name: Not on file  . Number of children: Not on file  . Years of education: Not on file  . Highest education level: Not on file  Occupational History  . Not on file  Social Needs  . Financial resource strain: Not on file  . Food insecurity:    Worry: Not on file    Inability: Not on file  . Transportation needs:    Medical: Not on file    Non-medical: Not on file  Tobacco Use  . Smoking  status: Current Every Day Smoker    Packs/day: 3.00    Years: 15.00    Pack years: 45.00    Types: Cigarettes  . Smokeless tobacco: Never Used  Substance and Sexual Activity  . Alcohol use: Yes    Alcohol/week: 20.0 standard drinks    Types: 20 Shots of liquor per week    Comment: states goes through a 1/2 gallon a week (vodka and redbull)  . Drug use: No  . Sexual activity: Not on file  Lifestyle  . Physical activity:    Days per week: Not on file    Minutes per session: Not on file  . Stress: Not on file  Relationships  . Social connections:    Talks on phone: Not on file    Gets together: Not on file    Attends religious service: Not on file    Active member of club or organization: Not on file    Attends meetings of clubs or organizations: Not on file    Relationship status: Not on file  Other Topics Concern  . Not on  file  Social History Narrative   He is self employed - Corporate investment banker    Married    Has children        Outpatient Medications Prior to Visit  Medication Sig Dispense Refill  . allopurinol (ZYLOPRIM) 100 MG tablet Take 1 tablet (100 mg total) by mouth daily. (Patient not taking: Reported on 02/19/2018) 90 tablet 3   No facility-administered medications prior to visit.      EXAM:  BP 122/66 (BP Location: Right Arm, Patient Position: Sitting, Cuff Size: Normal)   Pulse 83   Temp 97.9 F (36.6 C) (Oral)   Wt 235 lb (106.6 kg)   BMI 31.00 kg/m   Body mass index is 31 kg/m.  GENERAL: vitals reviewed and listed above, alert, oriented, appears well hydrated and in no acute distress HEENT: atraumatic, conjunctiva  clear, no obvious abnormalities on inspection of external nose and ears  NECK: no obvious masses on inspection palpation  CV: HRRR, no clubbing cyanosis or  peripheral edema nl cap refill  abd well healed lap scars   No selling tender mild around UMBI no redness  Or color change   and 1 fb defect felt   MS: moves all extremities  without noticeable focal  abnormality PSYCH: pleasant and cooperative, no obvious depression or anxiety  ASSESSMENT AND PLAN:  Discussed the following assessment and plan:  Umbilical hernia without obstruction and without gangrene - hx of pain and  sx currently reduce to ed if unable to recude and or severe pain vomiting etc surgery referral  - Plan: Ambulatory referral to General Surgery  Umbilical pain congrats on tobacco cessation  Expectant management.  -Patient advised to return or notify health care team  if symptoms worsen ,persist or new concerns arise.  Patient Instructions  This is most likely an umbiliccal hernia .  No lifitng and if   punches out and  Cannot get back  And pain then go to the emergency room.     Umbilical Hernia, Adult A hernia is a bulge of tissue that pushes through an opening between muscles. An umbilical hernia happens in the abdomen, near the belly button (umbilicus). The hernia may contain tissues from the small intestine, large intestine, or fatty tissue covering the intestines (omentum). Umbilical hernias in adults tend to get worse over time, and they require surgical treatment. There are several types of umbilical hernias. You may have:  A hernia located just above or below the umbilicus (indirect hernia). This is the most common type of umbilical hernia in adults.  A hernia that forms through an opening formed by the umbilicus (direct hernia).  A hernia that comes and goes (reducible hernia). A reducible hernia may be visible only when you strain, lift something heavy, or cough. This type of hernia can be pushed back into the abdomen (reduced).  A hernia that traps abdominal tissue inside the hernia (incarcerated hernia). This type of hernia cannot be reduced.  A hernia that cuts off blood flow to the tissues inside the hernia (strangulated hernia). The tissues can start to die if this happens. This type of hernia requires emergency  treatment.  What are the causes? An umbilical hernia happens when tissue inside the abdomen presses on a weak area of the abdominal muscles. What increases the risk? You may have a greater risk of this condition if you:  Are obese.  Have had several pregnancies.  Have a buildup of fluid inside your abdomen (ascites).  Have had surgery  that weakens the abdominal muscles.  What are the signs or symptoms? The main symptom of this condition is a painless bulge at or near the belly button. A reducible hernia may be visible only when you strain, lift something heavy, or cough. Other symptoms may include:  Dull pain.  A feeling of pressure.  Symptoms of a strangulated hernia may include:  Pain that gets increasingly worse.  Nausea and vomiting.  Pain when pressing on the hernia.  Skin over the hernia becoming red or purple.  Constipation.  Blood in the stool.  How is this diagnosed? This condition may be diagnosed based on:  A physical exam. You may be asked to cough or strain while standing. These actions increase the pressure inside your abdomen and force the hernia through the opening in your muscles. Your health care provider may try to reduce the hernia by pressing on it.  Your symptoms and medical history.  How is this treated? Surgery is the only treatment for an umbilical hernia. Surgery for a strangulated hernia is done as soon as possible. If you have a small hernia that is not incarcerated, you may need to lose weight before having surgery. Follow these instructions at home:  Lose weight, if told by your health care provider.  Do not try to push the hernia back in.  Watch your hernia for any changes in color or size. Tell your health care provider if any changes occur.  You may need to avoid activities that increase pressure on your hernia.  Do not lift anything that is heavier than 10 lb (4.5 kg) until your health care provider says that this is  safe.  Take over-the-counter and prescription medicines only as told by your health care provider.  Keep all follow-up visits as told by your health care provider. This is important. Contact a health care provider if:  Your hernia gets larger.  Your hernia becomes painful. Get help right away if:  You develop sudden, severe pain near the area of your hernia.  You have pain as well as nausea or vomiting.  You have pain and the skin over your hernia changes color.  You develop a fever. This information is not intended to replace advice given to you by your health care provider. Make sure you discuss any questions you have with your health care provider. Document Released: 11/09/2015 Document Revised: 02/10/2016 Document Reviewed: 11/09/2015 Elsevier Interactive Patient Education  2018 [employer].     [place]. Assunta Pupo M.D.

## 2018-02-19 NOTE — Patient Instructions (Signed)
This is most likely an umbiliccal hernia .  No lifitng and if   punches out and  Cannot get back  And pain then go to the emergency room.     Umbilical Hernia, Adult A hernia is a bulge of tissue that pushes through an opening between muscles. An umbilical hernia happens in the abdomen, near the belly button (umbilicus). The hernia may contain tissues from the small intestine, large intestine, or fatty tissue covering the intestines (omentum). Umbilical hernias in adults tend to get worse over time, and they require surgical treatment. There are several types of umbilical hernias. You may have:  A hernia located just above or below the umbilicus (indirect hernia). This is the most common type of umbilical hernia in adults.  A hernia that forms through an opening formed by the umbilicus (direct hernia).  A hernia that comes and goes (reducible hernia). A reducible hernia may be visible only when you strain, lift something heavy, or cough. This type of hernia can be pushed back into the abdomen (reduced).  A hernia that traps abdominal tissue inside the hernia (incarcerated hernia). This type of hernia cannot be reduced.  A hernia that cuts off blood flow to the tissues inside the hernia (strangulated hernia). The tissues can start to die if this happens. This type of hernia requires emergency treatment.  What are the causes? An umbilical hernia happens when tissue inside the abdomen presses on a weak area of the abdominal muscles. What increases the risk? You may have a greater risk of this condition if you:  Are obese.  Have had several pregnancies.  Have a buildup of fluid inside your abdomen (ascites).  Have had surgery that weakens the abdominal muscles.  What are the signs or symptoms? The main symptom of this condition is a painless bulge at or near the belly button. A reducible hernia may be visible only when you strain, lift something heavy, or cough. Other symptoms may  include:  Dull pain.  A feeling of pressure.  Symptoms of a strangulated hernia may include:  Pain that gets increasingly worse.  Nausea and vomiting.  Pain when pressing on the hernia.  Skin over the hernia becoming red or purple.  Constipation.  Blood in the stool.  How is this diagnosed? This condition may be diagnosed based on:  A physical exam. You may be asked to cough or strain while standing. These actions increase the pressure inside your abdomen and force the hernia through the opening in your muscles. Your health care provider may try to reduce the hernia by pressing on it.  Your symptoms and medical history.  How is this treated? Surgery is the only treatment for an umbilical hernia. Surgery for a strangulated hernia is done as soon as possible. If you have a small hernia that is not incarcerated, you may need to lose weight before having surgery. Follow these instructions at home:  Lose weight, if told by your health care provider.  Do not try to push the hernia back in.  Watch your hernia for any changes in color or size. Tell your health care provider if any changes occur.  You may need to avoid activities that increase pressure on your hernia.  Do not lift anything that is heavier than 10 lb (4.5 kg) until your health care provider says that this is safe.  Take over-the-counter and prescription medicines only as told by your health care provider.  Keep all follow-up visits as told by your  health care provider. This is important. Contact a health care provider if:  Your hernia gets larger.  Your hernia becomes painful. Get help right away if:  You develop sudden, severe pain near the area of your hernia.  You have pain as well as nausea or vomiting.  You have pain and the skin over your hernia changes color.  You develop a fever. This information is not intended to replace advice given to you by your health care provider. Make sure you discuss  any questions you have with your health care provider. Document Released: 11/09/2015 Document Revised: 02/10/2016 Document Reviewed: 11/09/2015 Elsevier Interactive Patient Education  [employer].

## 2018-04-13 DIAGNOSIS — M109 Gout, unspecified: Secondary | ICD-10-CM | POA: Diagnosis not present

## 2018-09-09 ENCOUNTER — Ambulatory Visit: Payer: BLUE CROSS/BLUE SHIELD | Admitting: Adult Health

## 2018-09-09 ENCOUNTER — Encounter: Payer: Self-pay | Admitting: Adult Health

## 2018-09-09 ENCOUNTER — Other Ambulatory Visit: Payer: Self-pay

## 2018-09-09 VITALS — BP 130/80 | HR 83 | Temp 97.8°F

## 2018-09-09 DIAGNOSIS — B9789 Other viral agents as the cause of diseases classified elsewhere: Secondary | ICD-10-CM | POA: Diagnosis not present

## 2018-09-09 DIAGNOSIS — J069 Acute upper respiratory infection, unspecified: Secondary | ICD-10-CM

## 2018-09-09 NOTE — Patient Instructions (Signed)
-  I hope you feel better soon!  -May use zyrtec/claritin once a day, mucinex twice a day, delsym twice a day as needed for cough and over the counter flu/sinues medication as needed for pain and congestion.  -Come back to see [place] if no improvement in 10-14 days.

## 2018-09-09 NOTE — Progress Notes (Signed)
Acute Office Visit     CC/Reason for Visit: Cough, sweats  HPI: Jonathan Vaughn is a 43 y.o. male who is coming in today for the above mentioned reasons. Cough non-productive and mild sweats since yesterday. No fever, chills, no recent travel, no sick contacts. No other symptoms.   Past Medical/Surgical History: Past Medical History:  Diagnosis Date  . 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 GERD 03/21/2010   Qualifier: Diagnosis of  By: Nelson-Smith CMA (AAMA), Dottie    . Gout   . Headache(784.0) 01/10/2009   Qualifier: Diagnosis of  By: Gabriel Rung LPN, Harriett Sine    . LATERAL EPICONDYLITIS OF ELBOW 08/03/2009   Qualifier: Diagnosis of  By: Caryl Never MD, Bruce    . SHOULDER PAIN 01/23/2010   Qualifier: Diagnosis of  By: Gabriel Rung LPN, Harriett Sine      Past Surgical History:  Procedure Laterality Date  . CHOLECYSTECTOMY N/A 09/03/2016   Procedure: LAPAROSCOPIC CHOLECYSTECTOMY;  Surgeon: Emelia Loron, MD;  Location: [facility];  Service: General;  Laterality: N/A;  . TONSILLECTOMY      Social History:  reports that he has been smoking cigarettes. He has a 45.00 pack-year smoking history. He has never used smokeless tobacco. He reports current alcohol use of about 20.0 standard drinks of alcohol per week. He reports that he does not use drugs.  Allergies: Allergies  Allergen Reactions  . Azithromycin Rash    Family History:  Family History  Problem Relation Age of Onset  . Crohn's disease Other   . Gout Father      Current Outpatient Medications:  .  allopurinol (ZYLOPRIM) 100 MG tablet, Take 1 tablet (100 mg total) by mouth daily. (Patient not taking: Reported on 02/19/2018), Disp: 90 tablet, Rfl: 3  Review of Systems:  Constitutional: Denies fever, chills, , appetite change and fatigue.  HEENT: Denies photophobia, eye pain, redness, hearing loss, ear pain, congestion, sore throat, rhinorrhea, sneezing, mouth sores, trouble swallowing, neck pain, neck stiffness and  tinnitus.   Respiratory: Denies SOB, DOE, chest tightness,  and wheezing.   Cardiovascular: Denies chest pain, palpitations and leg swelling.  Gastrointestinal: Denies nausea, vomiting, abdominal pain, diarrhea, constipation, blood in stool and abdominal distention.  Genitourinary: Denies dysuria, urgency, frequency, hematuria, flank pain and difficulty urinating.  Endocrine: Denies: hot or cold intolerance, sweats, changes in hair or nails, polyuria, polydipsia. Musculoskeletal: Denies myalgias, back pain, joint swelling, arthralgias and gait problem.  Skin: Denies pallor, rash and wound.  Neurological: Denies dizziness, seizures, syncope, weakness, light-headedness, numbness and headaches.  Hematological: Denies adenopathy. Easy bruising, personal or family bleeding history  Psychiatric/Behavioral: Denies suicidal ideation, mood changes, confusion, nervousness, sleep disturbance and agitation    Physical Exam: Vitals:   09/09/18 1518  BP: 130/80  Pulse: 83  Temp: 97.8 F (36.6 C)  TempSrc: Oral  SpO2: 93%    There is no height or weight on file to calculate BMI.   Constitutional: NAD, calm, comfortable Eyes: PERRL, lids and conjunctivae normal ENMT: Mucous membranes are moist. Posterior pharynx clear of any exudate or lesions. Normal dentition. Tympanic membrane is pearly white, no erythema or bulging. Neck: normal, supple, no masses, no thyromegaly Respiratory: clear to auscultation bilaterally, no wheezing, no crackles. Normal respiratory effort. No accessory muscle use.  Cardiovascular: Regular rate and rhythm, no murmurs / rubs / gallops. No extremity edema. 2+ pedal pulses. No carotid bruits.     Impression and Plan:  Viral URI with cough  -Given exam findings, PNA,  pharyngitis, ear infection are not likely, hence abx have not been prescribed. -Have advised rest, fluids, OTC antihistamines, cough suppressants and mucinex. -RTC if no improvement in 10-14 days. -No  need for COVID testing at present.   Patient Instructions  -I hope you feel better soon!  -May use zyrtec/claritin once a day, mucinex twice a day, delsym twice a day as needed for cough and over the counter flu/sinues medication as needed for pain and congestion.  -Come back to see [place] if no improvement in 10-14 days.      Chaya Jan, MD [facility]

## 2018-11-11 ENCOUNTER — Other Ambulatory Visit: Payer: Self-pay | Admitting: General Surgery

## 2018-11-11 DIAGNOSIS — Z87891 Personal history of nicotine dependence: Secondary | ICD-10-CM | POA: Diagnosis not present

## 2018-11-11 DIAGNOSIS — K439 Ventral hernia without obstruction or gangrene: Secondary | ICD-10-CM | POA: Diagnosis not present

## 2018-11-11 DIAGNOSIS — Z9049 Acquired absence of other specified parts of digestive tract: Secondary | ICD-10-CM | POA: Diagnosis not present

## 2018-11-11 DIAGNOSIS — K219 Gastro-esophageal reflux disease without esophagitis: Secondary | ICD-10-CM | POA: Diagnosis not present

## 2018-11-18 NOTE — Pre-Procedure Instructions (Signed)
Jonathan Vaughn  11/18/2018      [id number] [name]- [place], [place] - [address] AT [facility] [address] & [facility] [address] [place] [address] Phone: [phone] Fax: [phone]    Your procedure is scheduled on June 1st.  Report to [facility] Entrance "A" Admitting at 7:30 A.M.  Call this number if you have problems the morning of surgery:  [phone]   Remember:  Do not eat or drink after midnight.  You may drink clear liquids until 6:30 AM .  Clear liquids allowed are:  Water, Juice (non-citric and without pulp), Carbonated beverages, Clear Tea, Black Coffee only, Plain Jell-O only, Gatorade and Plain Popsicles only    Take these medicines the morning of surgery with A SIP OF WATER - NONE  7 days prior to surgery STOP taking any Aspirin (unless otherwise instructed by your surgeon), Aleve, Naproxen, Ibuprofen, Motrin, Advil, Goody's, BC's, all herbal medications, fish oil, and all vitamins.     Do not wear jewelry  Do not wear lotions, powders, colognes, or deodorant.  Men may shave face and neck.  Do not bring valuables to the hospital.  [facility] is not responsible for any belongings or valuables.   [place]- Preparing For Surgery  Before surgery, you can play an important role. Because skin is not sterile, your skin needs to be as free of germs as possible. You can reduce the number of germs on your skin by washing with CHG (chlorahexidine gluconate) Soap before surgery.  CHG is an antiseptic cleaner which kills germs and bonds with the skin to continue killing germs even after washing.    Oral Hygiene is also important to reduce your risk of infection.  Remember - BRUSH YOUR TEETH THE MORNING OF SURGERY WITH YOUR REGULAR TOOTHPASTE  Please do not use if you have an allergy to CHG or antibacterial soaps. If your skin becomes reddened/irritated stop using the CHG.  Do not shave (including legs and underarms) for at least 48 hours prior  to first CHG shower. It is OK to shave your face.  Please follow these instructions carefully.   1. Shower the NIGHT BEFORE SURGERY and the MORNING OF SURGERY with CHG.   2. If you chose to wash your hair, wash your hair first as usual with your normal shampoo.  3. After you shampoo, rinse your hair and body thoroughly to remove the shampoo.  4. Use CHG as you would any other liquid soap. You can apply CHG directly to the skin and wash gently with a scrungie or a clean washcloth.   5. Apply the CHG Soap to your body ONLY FROM THE NECK DOWN.  Do not use on open wounds or open sores. Avoid contact with your eyes, ears, mouth and genitals (private parts). Wash Face and genitals (private parts)  with your normal soap.  6. Wash thoroughly, paying special attention to the area where your surgery will be performed.  7. Thoroughly rinse your body with warm water from the neck down.  8. DO NOT shower/wash with your normal soap after using and rinsing off the CHG Soap.  9. Pat yourself dry with a CLEAN TOWEL.  10. Wear CLEAN PAJAMAS to bed the night before surgery, wear comfortable clothes the morning of surgery  11. Place CLEAN SHEETS on your bed the night of your first shower and DO NOT SLEEP WITH PETS.   Day of Surgery:  Do not  apply any deodorants/lotions.  Please wear clean clothes to the hospital/surgery center.   Remember to brush your teeth WITH YOUR REGULAR TOOTHPASTE.   Contacts, dentures or bridgework may not be worn into surgery.  Leave your suitcase in the car.  After surgery it may be brought to your room.  For patients admitted to the hospital, discharge time will be determined by your treatment team.  Patients discharged the day of surgery will not be allowed to drive home.   Please read over the following fact sheets that you were given. Coughing and Deep Breathing and Surgical Site Infection Prevention

## 2018-11-19 ENCOUNTER — Other Ambulatory Visit: Payer: Self-pay

## 2018-11-19 ENCOUNTER — Encounter (HOSPITAL_COMMUNITY)
Admission: RE | Admit: 2018-11-19 | Discharge: 2018-11-19 | Disposition: A | Discharge status: Home / Self Care | Payer: BLUE CROSS/BLUE SHIELD | Source: Ambulatory Visit | Attending: General Surgery | Admitting: General Surgery

## 2018-11-19 ENCOUNTER — Encounter (HOSPITAL_COMMUNITY): Payer: Self-pay

## 2018-11-19 ENCOUNTER — Inpatient Hospital Stay (HOSPITAL_COMMUNITY): Admission: RE | Admit: 2018-11-19 | Payer: BLUE CROSS/BLUE SHIELD | Source: Ambulatory Visit

## 2018-11-19 DIAGNOSIS — Z01812 Encounter for preprocedural laboratory examination: Secondary | ICD-10-CM | POA: Diagnosis not present

## 2018-11-19 DIAGNOSIS — Z1159 Encounter for screening for other viral diseases: Secondary | ICD-10-CM | POA: Diagnosis not present

## 2018-11-19 LAB — COMPREHENSIVE METABOLIC PANEL
ALT: 67 U/L — ABNORMAL HIGH (ref 0–44)
AST: 30 U/L (ref 15–41)
Albumin: 4 g/dL (ref 3.5–5.0)
Alkaline Phosphatase: 72 U/L (ref 38–126)
Anion gap: 12 (ref 5–15)
BUN: 13 mg/dL (ref 6–20)
CO2: 22 mmol/L (ref 22–32)
Calcium: 9.1 mg/dL (ref 8.9–10.3)
Chloride: 105 mmol/L (ref 98–111)
Creatinine, Ser: 1.09 mg/dL (ref 0.61–1.24)
GFR calc Af Amer: >60 mL/min (ref >60)
GFR calc non Af Amer: >60 mL/min (ref >60)
Glucose, Bld: 128 mg/dL — ABNORMAL HIGH (ref 70–99)
Potassium: 3.9 mmol/L (ref 3.5–5.1)
Sodium: 139 mmol/L (ref 135–145)
Total Bilirubin: 0.5 mg/dL (ref 0.3–1.2)
Total Protein: 6.7 g/dL (ref 6.5–8.1)

## 2018-11-19 LAB — CBC WITH DIFFERENTIAL/PLATELET
Abs Immature Granulocytes: 0.03 10*3/uL (ref 0.00–0.07)
Basophils Absolute: 0.1 10*3/uL (ref 0.0–0.1)
Basophils Relative: 1 %
Eosinophils Absolute: 0.2 10*3/uL (ref 0.0–0.5)
Eosinophils Relative: 4 %
HCT: 44.8 % (ref 39.0–52.0)
Hemoglobin: 14.7 g/dL (ref 13.0–17.0)
Immature Granulocytes: 1 %
Lymphocytes Relative: 31 %
Lymphs Abs: 1.8 10*3/uL (ref 0.7–4.0)
MCH: 29.2 pg (ref 26.0–34.0)
MCHC: 32.8 g/dL (ref 30.0–36.0)
MCV: 88.9 fL (ref 80.0–100.0)
Monocytes Absolute: 0.3 10*3/uL (ref 0.1–1.0)
Monocytes Relative: 6 %
Neutro Abs: 3.3 10*3/uL (ref 1.7–7.7)
Neutrophils Relative %: 57 %
Platelets: 226 10*3/uL (ref 150–400)
RBC: 5.04 MIL/uL (ref 4.22–5.81)
RDW: 13.2 % (ref 11.5–15.5)
WBC: 5.7 10*3/uL (ref 4.0–10.5)
nRBC: 0 % (ref 0.0–0.2)

## 2018-11-19 NOTE — Progress Notes (Signed)
PCP - Dr. Loreta Ave Cardiologist - denies  Chest x-ray - N/A EKG - N/A Stress Test - denies ECHO - denies Cardiac Cath - denies  Sleep Study - denies  Aspirin Instructions: Patient instructed to hold all Aspirin, NSAID's, herbal medications, fish oil and vitamins 7 days prior to surgery.   Anesthesia review:   Patient denies shortness of breath, fever, cough and chest pain at PAT appointment   Patient verbalized understanding of instructions that were given to them at the PAT appointment. Patient was also instructed that they will need to review over the PAT instructions again at home before surgery.

## 2018-11-19 NOTE — Pre-Procedure Instructions (Signed)
Jonathan Vaughn  11/19/2018      [facility] #[id number] Jonathan Vaughn, [place] - [address] AT [facility] [address] & [facility] [address] [place] [address] Phone: [phone] Fax: [phone]    Your procedure is scheduled on June 1st.  Report to [facility] Entrance "A" Admitting at 7:00 A.M.  Call this number if you have problems the morning of surgery:  [phone]   Remember:  Do not eat or drink after midnight.  You may drink clear liquids until 6:30 AM .  Clear liquids allowed are:  Water, Juice (non-citric and without pulp), Carbonated beverages, Clear Tea, Black Coffee only, Plain Jell-O only, Gatorade and Plain Popsicles only    Take these medicines the morning of surgery with A SIP OF WATER - NONE  7 days prior to surgery STOP taking any Aspirin (unless otherwise instructed by your surgeon), Aleve, Naproxen, Ibuprofen, Motrin, Advil, Goody's, BC's, all herbal medications, fish oil, and all vitamins.     Do not wear jewelry  Do not wear lotions, powders, colognes, or deodorant.  Men may shave face and neck.  Do not bring valuables to the hospital.  [facility] is not responsible for any belongings or valuables.   [place]- Preparing For Surgery  Before surgery, you can play an important role. Because skin is not sterile, your skin needs to be as free of germs as possible. You can reduce the number of germs on your skin by washing with CHG (chlorahexidine gluconate) Soap before surgery.  CHG is an antiseptic cleaner which kills germs and bonds with the skin to continue killing germs even after washing.    Oral Hygiene is also important to reduce your risk of infection.  Remember - BRUSH YOUR TEETH THE MORNING OF SURGERY WITH YOUR REGULAR TOOTHPASTE  Please do not use if you have an allergy to CHG or antibacterial soaps. If your skin becomes reddened/irritated stop using the CHG.  Do not shave (including legs and underarms) for at least 48 hours prior  to first CHG shower. It is OK to shave your face.  Please follow these instructions carefully.   1. Shower the NIGHT BEFORE SURGERY and the MORNING OF SURGERY with CHG.   2. If you chose to wash your hair, wash your hair first as usual with your normal shampoo.  3. After you shampoo, rinse your hair and body thoroughly to remove the shampoo.  4. Use CHG as you would any other liquid soap. You can apply CHG directly to the skin and wash gently with a scrungie or a clean washcloth.   5. Apply the CHG Soap to your body ONLY FROM THE NECK DOWN.  Do not use on open wounds or open sores. Avoid contact with your eyes, ears, mouth and genitals (private parts). Wash Face and genitals (private parts)  with your normal soap.  6. Wash thoroughly, paying special attention to the area where your surgery will be performed.  7. Thoroughly rinse your body with warm water from the neck down.  8. DO NOT shower/wash with your normal soap after using and rinsing off the CHG Soap.  9. Pat yourself dry with a CLEAN TOWEL.  10. Wear CLEAN PAJAMAS to bed the night before surgery, wear comfortable clothes the morning of surgery  11. Place CLEAN SHEETS on your bed the night of your first shower and DO NOT SLEEP WITH PETS.   Day of Surgery:  Do not  apply any deodorants/lotions.  Please wear clean clothes to the hospital/surgery center.   Remember to brush your teeth WITH YOUR REGULAR TOOTHPASTE.   Contacts, dentures or bridgework may not be worn into surgery.  Leave your suitcase in the car.  After surgery it may be brought to your room.  For patients admitted to the hospital, discharge time will be determined by your treatment team.  Patients discharged the day of surgery will not be allowed to drive home.   Please read over the following fact sheets that you were given. Coughing and Deep Breathing and Surgical Site Infection Prevention

## 2018-11-19 NOTE — Progress Notes (Signed)
Spoke with Annice Pih at Dr. Aura Camps office regarding patient. Patient had a lot of confusion regarding his PAT and Covid testing appointments. Patient states he was not told he would have to quarantine at home following covid testing and has to work this weekend. Patient will need Rapid Covid test DOS. Patient instructed to arrive at [facility] at 0700 DOS. Order for Novel changed to rapid. Annice Pih will let Dr. Derrell Lolling know of changes and plan.

## 2018-11-20 MED ORDER — BUPIVACAINE LIPOSOME 1.3 % IJ SUSP
20.0000 mL | Freq: Once | INTRAMUSCULAR | Status: AC
  Administered 2018-11-22: 20 mL
  Filled 2018-11-20: qty 20

## 2018-11-21 NOTE — H&P (Signed)
Jonathan Vaughn Location: [facility] Patient #: [id number] DOB: 04/02/1976 Married / Language: English / Race: White Male        History of Present Illness  . This is a 43 year old man, self-referred for hernia above and to the right of his umbilicus. PCP is [place] but he doesn't go very often.     He felt something that is occasionally tender at his umbilicus for about 1 year. One week ago when he was exerting himself it bulged out , larger than an egg, to the right and was very tender temporarily and now has reduced. He's never had hernia surgery in the past. He did have a laparoscopic cholecystectomy by Dr. [facility] in the past. Denies nausea or vomiting Past history reveals laparoscopic cholecystectomy 2018. Former tobacco use quit smoking 1 year ago. GERD. Family history mother living has AICD follow living has Crohn's disease gout and hepatitis C Social history reveals he is married with 2 children. Scuba diver.Denies alcohol.     Exam reveals a small umbilical hernia and may be a small defect above this in the midline but I do not feel a large hernia or hernia sac as described. I think the best thing to do is to repair this laparoscopically as he may have more than one hernia and this may be an incisional hernia so I may larger piece of mesh in that setting. He agrees and wants to go ahead as soon as possible because the pain was very severe last week     He'll be scheduled for laparoscopic repair of periumbilical ventral hernia possible incisional hernia with mesh I discussed the indications, details, techniques, and numerous risk of the surgery with him. He is aware of the risk of bleeding, infection, conversion to open laparotomy, recurrence, injury to adjacent organs of major reconstructive surgery. He understands all of these issues. All his questions are answered. He agrees with this plan. He knows that he will not be able to do any  sports or strenuous work for 30 days Plan to do this as an outpatient if everything goes well  Plan intraoperative TAPP block with exparel     Past Surgical History  Vasectomy   Diagnostic Studies History  Colonoscopy  never  Allergies  No Known Drug Allergies  Allergies Reconciled   Medication History No Current Medications Medications Reconciled  Social History Alcohol use  Recently quit alcohol use. Caffeine use  Carbonated beverages. Illicit drug use  Remotely quit drug use. Tobacco use  Former smoker.  Family History  Arthritis  Mother. Bleeding disorder  Mother. Depression  Father. Heart Disease  Father. Hypertension  Father. Kidney Disease  Father.  Other Problems  Inguinal Hernia     Review of Systems  General Not Present- Appetite Loss, Chills, Fatigue, Fever, Night Sweats, Weight Gain and Weight Loss. Skin Not Present- Change in Wart/Mole, Dryness, Hives, Jaundice, New Lesions, Non-Healing Wounds, Rash and Ulcer. HEENT Present- Wears glasses/contact lenses. Not Present- Earache, Hearing Loss, Hoarseness, Nose Bleed, Oral Ulcers, Ringing in the Ears, Seasonal Allergies, Sinus Pain, Sore Throat, Visual Disturbances and Yellow Eyes. Respiratory Not Present- Bloody sputum, Chronic Cough, Difficulty Breathing, Snoring and Wheezing. Breast Not Present- Breast Mass, Breast Pain, Nipple Discharge and Skin Changes. Gastrointestinal Present- Bloody Stool. Not Present- Abdominal Pain, Bloating, Change in Bowel Habits, Chronic diarrhea, Constipation, Difficulty Swallowing, Excessive gas, Gets full quickly at meals, Hemorrhoids, Indigestion, Nausea, Rectal Pain and Vomiting. Male Genitourinary Not Present- Blood in Urine, Change  in Urinary Stream, Frequency, Impotence, Nocturia, Painful Urination, Urgency and Urine Leakage.  Vitals  Weight: 251.2 lb Height: 73in Body Surface Area: 2.37 m Body Mass Index: 33.14 kg/m  Temp.: 98.21F(Oral)   Pulse: 89 (Regular)  P.OX: 98% (Room air) BP: 142/90 (Sitting, Right Arm, Standard)    Physical Exam  General Mental Status-Alert. General Appearance-Consistent with stated age. Hydration-Well hydrated. Voice-Normal. Note: BMI 33. Mild obesity.   Head and Neck Head-normocephalic, atraumatic with no lesions or palpable masses. Trachea-midline. Thyroid Gland Characteristics - normal size and consistency.  Eye Eyeball - Bilateral-Extraocular movements intact. Sclera/Conjunctiva - Bilateral-No scleral icterus.  Chest and Lung Exam Chest and lung exam reveals -quiet, even and easy respiratory effort with no use of accessory muscles and on auscultation, normal breath sounds, no adventitious sounds and normal vocal resonance. Inspection Chest Wall - Normal. Back - normal.  Cardiovascular Cardiovascular examination reveals -normal heart sounds, regular rate and rhythm with no murmurs and normal pedal pulses bilaterally.  Abdomen Inspection  Inspection of the abdomen reveals: Note: Examined supine and standing. He has a small defect at the base of his umbilicus. He also seems to be a small defect in the midline above the umbilicus. I don't feel a large hernia sac as described. No inguinal hernia. Skin - Scar - no surgical scars. Palpation/Percussion Palpation and Percussion of the abdomen reveal - Soft, Non Tender, No Rebound tenderness, No Rigidity (guarding) and No hepatosplenomegaly. Auscultation Auscultation of the abdomen reveals - Bowel sounds normal.  Neurologic Neurologic evaluation reveals -alert and oriented x 3 with no impairment of recent or remote memory. Mental Status-Normal.  Musculoskeletal Normal Exam - Left-Upper Extremity Strength Normal and Lower Extremity Strength Normal. Normal Exam - Right-Upper Extremity Strength Normal and Lower Extremity Strength Normal.  Lymphatic Head & Neck  General Head & Neck Lymphatics:  Bilateral - Description - Normal. Axillary  General Axillary Region: Bilateral - Description - Normal. Tenderness - Non Tender. Femoral & Inguinal  Generalized Femoral & Inguinal Lymphatics: Bilateral - Description - Normal. Tenderness - Non Tender.    Assessment & Plan  VENTRAL HERNIA WITHOUT OBSTRUCTION OR GANGRENE (K43.9)   you have a hernia at your umbilicus and perhaps a second one just above it This may have developed on its own or it may have something to do with the laparoscopic cholecystectomy that was performed in 2018  You had a recent episode of severe pain and the bulge got larger temporarily This may have been a temporary incarceration  You have requested to go ahead and surgically repair this hernia I have told you that you may not play sports or do any heavy lifting for 1 month you will be scheduled for laparoscopic repair of ventral hernia with mesh I have discussed the indications, techniques, and risk of the surgery in detail Please read over the information booklet that I reviewed with you  HISTORY OF LAPAROSCOPIC CHOLECYSTECTOMY (Z90.49) CHRONIC GERD (K21.9) FORMER SMOKER (G25.427)    Angelia Mould. Derrell Lolling, M.D., [facility] General and Minimally invasive Surgery Breast and Colorectal Surgery Office:   [phone] Pager:   [phone]

## 2018-11-22 ENCOUNTER — Other Ambulatory Visit: Payer: Self-pay

## 2018-11-22 ENCOUNTER — Ambulatory Visit (HOSPITAL_COMMUNITY)
Admission: RE | Admit: 2018-11-22 | Discharge: 2018-11-22 | Disposition: A | Discharge status: Home / Self Care | Payer: BLUE CROSS/BLUE SHIELD | Attending: General Surgery | Admitting: General Surgery

## 2018-11-22 ENCOUNTER — Ambulatory Visit (HOSPITAL_COMMUNITY): Payer: BLUE CROSS/BLUE SHIELD | Admitting: Certified Registered Nurse Anesthetist

## 2018-11-22 ENCOUNTER — Encounter (HOSPITAL_COMMUNITY): Payer: Self-pay

## 2018-11-22 ENCOUNTER — Encounter (HOSPITAL_COMMUNITY)
Admission: RE | Disposition: A | Discharge status: Home / Self Care | Payer: Self-pay | Source: Home / Self Care | Attending: General Surgery

## 2018-11-22 DIAGNOSIS — Z87891 Personal history of nicotine dependence: Secondary | ICD-10-CM | POA: Diagnosis not present

## 2018-11-22 DIAGNOSIS — E669 Obesity, unspecified: Secondary | ICD-10-CM | POA: Diagnosis not present

## 2018-11-22 DIAGNOSIS — K439 Ventral hernia without obstruction or gangrene: Secondary | ICD-10-CM | POA: Diagnosis not present

## 2018-11-22 DIAGNOSIS — K430 Incisional hernia with obstruction, without gangrene: Secondary | ICD-10-CM | POA: Diagnosis present

## 2018-11-22 DIAGNOSIS — K429 Umbilical hernia without obstruction or gangrene: Secondary | ICD-10-CM | POA: Diagnosis not present

## 2018-11-22 DIAGNOSIS — M1990 Unspecified osteoarthritis, unspecified site: Secondary | ICD-10-CM | POA: Diagnosis not present

## 2018-11-22 DIAGNOSIS — Z1159 Encounter for screening for other viral diseases: Secondary | ICD-10-CM | POA: Insufficient documentation

## 2018-11-22 DIAGNOSIS — Z6832 Body mass index (BMI) 32.0-32.9, adult: Secondary | ICD-10-CM | POA: Insufficient documentation

## 2018-11-22 DIAGNOSIS — K2970 Gastritis, unspecified, without bleeding: Secondary | ICD-10-CM | POA: Diagnosis not present

## 2018-11-22 DIAGNOSIS — M25519 Pain in unspecified shoulder: Secondary | ICD-10-CM | POA: Diagnosis not present

## 2018-11-22 HISTORY — PX: VENTRAL HERNIA REPAIR: SHX424

## 2018-11-22 HISTORY — PX: INSERTION OF MESH: SHX5868

## 2018-11-22 HISTORY — DX: Incisional hernia with obstruction, without gangrene: K43.0

## 2018-11-22 LAB — SARS CORONAVIRUS 2 BY RT PCR (HOSPITAL ORDER, PERFORMED IN ~~LOC~~ HOSPITAL LAB): SARS Coronavirus 2: NEGATIVE

## 2018-11-22 SURGERY — REPAIR, HERNIA, VENTRAL, LAPAROSCOPIC
Anesthesia: General | Site: Abdomen

## 2018-11-22 MED ORDER — BUPIVACAINE-EPINEPHRINE (PF) 0.25% -1:200000 IJ SOLN
INTRAMUSCULAR | Status: AC
  Filled 2018-11-22: qty 30

## 2018-11-22 MED ORDER — STERILE WATER FOR IRRIGATION IR SOLN
Status: DC | PRN
  Administered 2018-11-22: 1000 mL

## 2018-11-22 MED ORDER — CELECOXIB 200 MG PO CAPS
200.0000 mg | ORAL_CAPSULE | ORAL | Status: AC
  Administered 2018-11-22: 08:00:00 200 mg via ORAL
  Filled 2018-11-22: qty 1

## 2018-11-22 MED ORDER — ACETAMINOPHEN 500 MG PO TABS
1000.0000 mg | ORAL_TABLET | ORAL | Status: AC
  Administered 2018-11-22: 1000 mg via ORAL
  Filled 2018-11-22: qty 2

## 2018-11-22 MED ORDER — FENTANYL CITRATE (PF) 100 MCG/2ML IJ SOLN
25.0000 ug | INTRAMUSCULAR | Status: DC | PRN
  Administered 2018-11-22: 11:00:00 25 ug via INTRAVENOUS
  Administered 2018-11-22: 50 ug via INTRAVENOUS
  Administered 2018-11-22: 25 ug via INTRAVENOUS

## 2018-11-22 MED ORDER — PROPOFOL 10 MG/ML IV BOLUS
INTRAVENOUS | Status: DC | PRN
  Administered 2018-11-22: 150 mg via INTRAVENOUS

## 2018-11-22 MED ORDER — PHENYLEPHRINE 40 MCG/ML (10ML) SYRINGE FOR IV PUSH (FOR BLOOD PRESSURE SUPPORT)
PREFILLED_SYRINGE | INTRAVENOUS | Status: AC
  Filled 2018-11-22: qty 10

## 2018-11-22 MED ORDER — SUGAMMADEX SODIUM 200 MG/2ML IV SOLN
INTRAVENOUS | Status: DC | PRN
  Administered 2018-11-22: 250 mg via INTRAVENOUS

## 2018-11-22 MED ORDER — CHLORHEXIDINE GLUCONATE CLOTH 2 % EX PADS: 6.0000 | MEDICATED_PAD | Freq: Once | CUTANEOUS | Status: DC

## 2018-11-22 MED ORDER — HYDROCODONE-ACETAMINOPHEN 5-325 MG PO TABS: 1.0000 | ORAL_TABLET | Freq: Four times a day (QID) | ORAL | 0 refills | Status: DC | PRN

## 2018-11-22 MED ORDER — OXYCODONE HCL 5 MG PO TABS
ORAL_TABLET | ORAL | Status: AC
  Filled 2018-11-22: qty 2

## 2018-11-22 MED ORDER — ONDANSETRON HCL 4 MG/2ML IJ SOLN
INTRAMUSCULAR | Status: DC | PRN
  Administered 2018-11-22: 4 mg via INTRAVENOUS

## 2018-11-22 MED ORDER — FENTANYL CITRATE (PF) 100 MCG/2ML IJ SOLN
INTRAMUSCULAR | Status: AC
  Filled 2018-11-22: qty 2

## 2018-11-22 MED ORDER — 0.9 % SODIUM CHLORIDE (POUR BTL) OPTIME
TOPICAL | Status: DC | PRN
  Administered 2018-11-22: 1000 mL

## 2018-11-22 MED ORDER — MIDAZOLAM HCL 2 MG/2ML IJ SOLN
INTRAMUSCULAR | Status: AC
  Filled 2018-11-22: qty 2

## 2018-11-22 MED ORDER — FENTANYL CITRATE (PF) 250 MCG/5ML IJ SOLN
INTRAMUSCULAR | Status: AC
  Filled 2018-11-22: qty 5

## 2018-11-22 MED ORDER — BUPIVACAINE-EPINEPHRINE 0.25% -1:200000 IJ SOLN: INTRAMUSCULAR | Status: DC | PRN

## 2018-11-22 MED ORDER — DEXAMETHASONE SODIUM PHOSPHATE 10 MG/ML IJ SOLN
INTRAMUSCULAR | Status: DC | PRN
  Administered 2018-11-22: 10 mg via INTRAVENOUS

## 2018-11-22 MED ORDER — CEFAZOLIN SODIUM-DEXTROSE 2-4 GM/100ML-% IV SOLN
2.0000 g | INTRAVENOUS | Status: AC
  Administered 2018-11-22: 2 g via INTRAVENOUS
  Filled 2018-11-22: qty 100

## 2018-11-22 MED ORDER — SODIUM CHLORIDE (PF) 0.9 % IJ SOLN
INTRAMUSCULAR | Status: DC | PRN
  Administered 2018-11-22 (×2): 10 mL

## 2018-11-22 MED ORDER — ROCURONIUM BROMIDE 10 MG/ML (PF) SYRINGE
PREFILLED_SYRINGE | INTRAVENOUS | Status: DC | PRN
  Administered 2018-11-22: 20 mg via INTRAVENOUS
  Administered 2018-11-22: 70 mg via INTRAVENOUS

## 2018-11-22 MED ORDER — LIDOCAINE 2% (20 MG/ML) 5 ML SYRINGE
INTRAMUSCULAR | Status: DC | PRN
  Administered 2018-11-22: 100 mg via INTRAVENOUS

## 2018-11-22 MED ORDER — GABAPENTIN 300 MG PO CAPS
300.0000 mg | ORAL_CAPSULE | ORAL | Status: AC
  Administered 2018-11-22: 300 mg via ORAL
  Filled 2018-11-22: qty 1

## 2018-11-22 MED ORDER — FENTANYL CITRATE (PF) 250 MCG/5ML IJ SOLN
INTRAMUSCULAR | Status: DC | PRN
  Administered 2018-11-22 (×2): 50 ug via INTRAVENOUS
  Administered 2018-11-22: 100 ug via INTRAVENOUS

## 2018-11-22 MED ORDER — SODIUM CHLORIDE 0.9% FLUSH: 3.0000 mL | Freq: Two times a day (BID) | INTRAVENOUS | Status: DC

## 2018-11-22 MED ORDER — LACTATED RINGERS IV SOLN
INTRAVENOUS | Status: DC
  Administered 2018-11-22: 08:00:00 via INTRAVENOUS

## 2018-11-22 MED ORDER — MIDAZOLAM HCL 2 MG/2ML IJ SOLN
INTRAMUSCULAR | Status: DC | PRN
  Administered 2018-11-22: 2 mg via INTRAVENOUS

## 2018-11-22 MED ORDER — OXYCODONE HCL 5 MG PO TABS
10.0000 mg | ORAL_TABLET | Freq: Once | ORAL | Status: AC
  Administered 2018-11-22: 10 mg via ORAL

## 2018-11-22 SURGICAL SUPPLY — 39 items
ADH SKN CLS APL DERMABOND .7 (GAUZE/BANDAGES/DRESSINGS) ×1
APL PRP STRL LF DISP 70% ISPRP (MISCELLANEOUS) ×1
BINDER ABD UNIV 10 28-50 (GAUZE/BANDAGES/DRESSINGS) IMPLANT
BINDER ABDOM UNIV 10 (GAUZE/BANDAGES/DRESSINGS) ×2
BLADE CLIPPER SURG (BLADE) ×1 IMPLANT
CHLORAPREP W/TINT 26 (MISCELLANEOUS) ×1 IMPLANT
COVER SURGICAL LIGHT HANDLE (MISCELLANEOUS) ×2 IMPLANT
DECANTER SPIKE VIAL GLASS SM (MISCELLANEOUS) ×2 IMPLANT
DERMABOND ADVANCED (GAUZE/BANDAGES/DRESSINGS) ×1
DERMABOND ADVANCED .7 DNX12 (GAUZE/BANDAGES/DRESSINGS) ×1 IMPLANT
DEVICE SECURE STRAP 25 ABSORB (INSTRUMENTS) ×3 IMPLANT
DEVICE TROCAR PUNCTURE CLOSURE (ENDOMECHANICALS) ×2 IMPLANT
ELECT REM PT RETURN 9FT ADLT (ELECTROSURGICAL) ×2
ELECTRODE REM PT RTRN 9FT ADLT (ELECTROSURGICAL) ×1 IMPLANT
GLOVE EUDERMIC 7 POWDERFREE (GLOVE) ×2 IMPLANT
GOWN STRL REUS W/ TWL LRG LVL3 (GOWN DISPOSABLE) ×2 IMPLANT
GOWN STRL REUS W/ TWL XL LVL3 (GOWN DISPOSABLE) ×1 IMPLANT
GOWN STRL REUS W/TWL LRG LVL3 (GOWN DISPOSABLE) ×4
GOWN STRL REUS W/TWL XL LVL3 (GOWN DISPOSABLE) ×2
KIT BASIN OR (CUSTOM PROCEDURE TRAY) ×2 IMPLANT
KIT TURNOVER KIT B (KITS) ×2 IMPLANT
MARKER SKIN DUAL TIP RULER LAB (MISCELLANEOUS) ×2 IMPLANT
MESH VENTRALIGHT ST 6IN CRC (Mesh General) ×1 IMPLANT
NDL SPNL 22GX3.5 QUINCKE BK (NEEDLE) ×1 IMPLANT
NEEDLE SPNL 22GX3.5 QUINCKE BK (NEEDLE) ×2 IMPLANT
NS IRRIG 1000ML POUR BTL (IV SOLUTION) ×2 IMPLANT
PAD ABD 8X10 STRL (GAUZE/BANDAGES/DRESSINGS) ×1 IMPLANT
PAD ARMBOARD 7.5X6 YLW CONV (MISCELLANEOUS) ×4 IMPLANT
SCISSORS LAP 5X35 DISP (ENDOMECHANICALS) ×1 IMPLANT
SET TUBE SMOKE EVAC HIGH FLOW (TUBING) ×2 IMPLANT
SLEEVE ENDOPATH XCEL 5M (ENDOMECHANICALS) ×3 IMPLANT
SUT MNCRL AB 4-0 PS2 18 (SUTURE) ×3 IMPLANT
SUT NOVA NAB DX-16 0-1 5-0 T12 (SUTURE) ×2 IMPLANT
TRAY FOLEY CATH 16FRSI W/METER (SET/KITS/TRAYS/PACK) ×1 IMPLANT
TRAY LAPAROSCOPIC MC (CUSTOM PROCEDURE TRAY) ×2 IMPLANT
TROCAR ADV FIXATION 12X100MM (TROCAR) ×1 IMPLANT
TROCAR ADV FIXATION 5X100MM (TROCAR) ×1 IMPLANT
TROCAR XCEL NON-BLD 5MMX100MML (ENDOMECHANICALS) ×2 IMPLANT
WATER STERILE IRR 1000ML POUR (IV SOLUTION) ×2 IMPLANT

## 2018-11-22 NOTE — Interval H&P Note (Signed)
History and Physical Interval Note:  11/22/2018 8:05 AM  Jonathan Vaughn  has presented today for surgery, with the diagnosis of ventral hernia.  The various methods of treatment have been discussed with the patient and family. After consideration of risks, benefits and other options for treatment, the patient has consented to  Procedure(s): LAPAROSCOPIC REPAIR VENTRAL HERNIA WITH MESH (N/A) as a surgical intervention.  The patient's history has been reviewed, patient examined, no change in status, stable for surgery.  I have reviewed the patient's chart and labs.  Questions were answered to the patient's satisfaction.     Ernestene Mention

## 2018-11-22 NOTE — Op Note (Signed)
Patient Name:           Jonathan Vaughn   Date of Surgery:        11/22/2018  Pre op Diagnosis:      Incarcerated incisional hernia  Post op Diagnosis:    Incarcerated incisional hernia  Procedure:                 Laparoscopic repair of incarcerated incisional hernia with underlay composite mesh (15.2 cm x 15.2 cm ventra-light ST mesh)  Surgeon:                     Angelia Mould. Derrell Lolling, M.D., FACS  Assistant:                      OR staff  Operative Indications:   This is a 43 year old man, self-referred for hernia above and to the right of his umbilicus. PCP is [place] at [facility] but he doesn't go very often.     He felt something that is occasionally tender at his umbilicus for about 1 year. One week ago when he was exerting himself it bulged out , larger than an egg, to the right and was very tender temporarily and now has reduced. He's never had hernia surgery in the past. He did have a laparoscopic cholecystectomy  in the past. Past history reveals laparoscopic cholecystectomy 2018. Former tobacco use quit smoking 1 year ago. GERD.     Exam reveals a small umbilical hernia and may be a small defect above this in the midline but I do not feel a large hernia or hernia sac as described. I think the best thing to do is to repair this laparoscopically as he may have more than one hernia and this may be an incisional hernia so I may larger piece of mesh in that setting. He agrees and wants to go ahead as soon as possible because the pain was very severe last week     He'll be scheduled for laparoscopic repair of periumbilical ventral hernia possible incisional hernia with mesh   Operative Findings:       He had a small umbilical hernia.  To the right of that there was an incarcerated incisional hernia with omentum incarcerated in the small hernia neck.  I was able to debride all of this omentum out and there was no retained  contents.  I was able to cover both defects with a 5 to 6 cm  overlap.  Procedure in Detail:          Following the induction of general endotracheal anesthesia the patient's abdomen was prepped and draped in a sterile fashion.  Surgical timeout was performed.  Intravenous antibiotics were given.  A 50% solution of Exparel was used as a local infiltration anesthetic, and at the end of the case I injected Exparel into the deep muscle layers on the anterior axillary line to block the sensory nerves.       A small incision was made in the left subcostal region.  A Veress needle was inserted and tested.  Insufflation was carried out without difficulty.  The Veress needle was removed and an optical trocar was inserted.  Visualization was good.  We saw the incarcerated hernia containing omentum but four-quadrant inspection at the beginning and at the end of the case revealed no visceral or peritoneal disease and no evidence of injury or bleeding.  11 mm trocar was placed in the left flank.  5  mm trocar placed in the left lower quadrant.  5 mm trocar placed in the right flank.      I debrided the omentum out of the hernia sac with cautery scissors.  I used a spinal needle to mark the limits of the umbilical and incisional hernia.  Using a ruler I measured 5 to 6 cm in all direction and found that I could get good coverage with a 15.2 cm circular composite mesh, ventral light ST.  This mesh was brought to the operative field.  I desufflated the abdomen and drew a template on the abdominal wall for for fixation sutures.  #1 Novafil sutures were placed at 4 equidistant points at the edge of the mesh.  The mesh was marked so as to be careful to keep the rough side of the mesh up toward the abdominal wall and the smooth side down toward the viscera.  The mesh was moistened and inserted through the trocar.  The mesh was spread out and positioned according to the template that was marked on the abdominal wall and the mesh.  A small puncture wound was made at each of the 4 suture  fixation sites.  Using a Endo Close device I drew the Novafil sutures up through the abdominal wall, being careful to take about a 1 cm bite of tissue.  After all of the Novafil sutures were passed to the abdominal wall this was lifted up and it deployed quite nicely.  All 4 sutures were tied.  The abdomen was insufflated.  I further secured the mesh to the abdominal wall with a secure strap device, double crown technique.  This was inspected from both right and left sides on 3 or 4 occasions and it looked like a good fixation without any defects or gaps in the closure.  There was no bleeding.  The pneumoperitoneum was released through the closed system.  The trocars were removed.  All of the incisions were closed with subcuticular 4-0 Monocryl and Dermabond.  Abdominal binder was placed and the patient taken to PACU in stable condition.  EBL 10 cc or less.  Counts correct.  Complications none.    Addendum: I logged onto the PMP aware website and reviewed his prescription medication history     Aliviana Burdell M. Derrell LollingIngram, M.D., FACS General and Minimally Invasive Surgery Breast and Colorectal Surgery  11/22/2018 9:55 AM

## 2018-11-22 NOTE — Transfer of Care (Signed)
Immediate Anesthesia Transfer of Care Note  Patient: Jonathan Vaughn  Procedure(s) Performed: LAPAROSCOPIC REPAIR VENTRAL HERNIA WITH MESH (N/A Abdomen) Insertion Of Mesh (N/A Abdomen)  Patient Location: PACU  Anesthesia Type:General  Level of Consciousness: drowsy and patient cooperative  Airway & Oxygen Therapy: Patient Spontanous Breathing  Post-op Assessment: Report given to RN, Post -op Vital signs reviewed and stable and Patient moving all extremities X 4  Post vital signs: Reviewed and stable  Last Vitals:  Vitals Value Taken Time  BP 129/87 11/22/2018 10:07 AM  Temp    Pulse 77 11/22/2018 10:07 AM  Resp 13 11/22/2018 10:07 AM  SpO2 78 % 11/22/2018 10:07 AM  Vitals shown include unvalidated device data.  Last Pain:  Vitals:   11/22/18 0759  PainSc: 0-No pain      Patients Stated Pain Goal: 3 (11/22/18 0759)  Complications: No apparent anesthesia complications

## 2018-11-22 NOTE — Anesthesia Procedure Notes (Signed)
Procedure Name: Intubation Date/Time: 11/22/2018 8:56 AM Performed by: Julieta Bellini, CRNA Pre-anesthesia Checklist: Patient identified, Emergency Drugs available, Suction available and Patient being monitored Patient Re-evaluated:Patient Re-evaluated prior to induction Oxygen Delivery Method: Circle system utilized Preoxygenation: Pre-oxygenation with 100% oxygen Induction Type: IV induction Ventilation: Mask ventilation without difficulty Laryngoscope Size: Mac and 4 Tube type: Oral Tube size: 7.5 mm Number of attempts: 1 Airway Equipment and Method: Stylet and Oral airway Placement Confirmation: ETT inserted through vocal cords under direct vision,  positive ETCO2 and breath sounds checked- equal and bilateral Secured at: 23 cm Tube secured with: Tape Dental Injury: Teeth and Oropharynx as per pre-operative assessment

## 2018-11-22 NOTE — Anesthesia Postprocedure Evaluation (Signed)
Anesthesia Post Note  Patient: [place] T Narula  Procedure(s) Performed: LAPAROSCOPIC REPAIR VENTRAL HERNIA WITH MESH (N/A Abdomen) Insertion Of Mesh (N/A Abdomen)     Patient location during evaluation: PACU Anesthesia Type: General Level of consciousness: awake and alert Pain management: pain level controlled Vital Signs Assessment: post-procedure vital signs reviewed and stable Respiratory status: spontaneous breathing, nonlabored ventilation, respiratory function stable and patient connected to nasal cannula oxygen Cardiovascular status: blood pressure returned to baseline and stable Postop Assessment: no apparent nausea or vomiting Anesthetic complications: no    Last Vitals:  Vitals:   11/22/18 1152 11/22/18 1205  BP: 123/72 132/75  Pulse: 75   Resp: 12 12  Temp:  (!) 36.2 C  SpO2: 96% 93%    Last Pain:  Vitals:   11/22/18 1205  PainSc: Asleep                 Alfredo Collymore L Shron Ozer

## 2018-11-22 NOTE — Anesthesia Preprocedure Evaluation (Addendum)
Anesthesia Evaluation  Patient identified by MRN, date of birth, ID band Patient awake    Reviewed: Allergy & Precautions, NPO status , Patient's Chart, lab work & pertinent test results  Airway Mallampati: I  TM Distance: >3 FB Neck ROM: Full    Dental no notable dental hx. (+) Caps,    Pulmonary neg pulmonary ROS, former smoker,    Pulmonary exam normal breath sounds clear to auscultation       Cardiovascular negative cardio ROS Normal cardiovascular exam Rhythm:Regular Rate:Normal     Neuro/Psych  Headaches, negative psych ROS   GI/Hepatic Neg liver ROS, GERD  ,  Endo/Other  negative endocrine ROS  Renal/GU negative Renal ROS  negative genitourinary   Musculoskeletal  (+) Arthritis ,   Abdominal   Peds negative pediatric ROS (+)  Hematology negative hematology ROS (+)   Anesthesia Other Findings   Reproductive/Obstetrics negative OB ROS                            Anesthesia Physical Anesthesia Plan  ASA: II  Anesthesia Plan: General   Post-op Pain Management:    Induction: Intravenous  PONV Risk Score and Plan: 2 and Ondansetron, Dexamethasone and Midazolam  Airway Management Planned: Oral ETT  Additional Equipment:   Intra-op Plan:   Post-operative Plan: Extubation in OR  Informed Consent: I have reviewed the patients History and Physical, chart, labs and discussed the procedure including the risks, benefits and alternatives for the proposed anesthesia with the patient or authorized representative who has indicated his/her understanding and acceptance.     Dental advisory given  Plan Discussed with: CRNA  Anesthesia Plan Comments:         Anesthesia Quick Evaluation

## 2018-11-22 NOTE — Discharge Instructions (Signed)
[facility] ______[facility] °LAPAROSCOPIC SURGERY: POST OP INSTRUCTIONS °Always review your discharge instruction sheet given to you by the facility where your surgery was performed. °IF YOU HAVE DISABILITY OR FAMILY LEAVE FORMS, YOU MUST BRING THEM TO THE OFFICE FOR PROCESSING.   °DO NOT GIVE THEM TO YOUR DOCTOR. ° °1. A prescription for pain medication may be given to you upon discharge.  Take your pain medication as prescribed, if needed.  If narcotic pain medicine is not needed, then you may take acetaminophen (Tylenol) or ibuprofen (Advil) as needed. °2. Take your usually prescribed medications unless otherwise directed. °3. If you need a refill on your pain medication, please contact your pharmacy.  They will contact our office to request authorization. Prescriptions will not be filled after 5pm or on week-ends. °4. You should follow a light diet the first few days after arrival home, such as soup and crackers, etc.  Be sure to include lots of fluids daily. °5. Most patients will experience some swelling and bruising in the area of the incisions.  Ice packs will help.  Swelling and bruising can take several days to resolve.  °6. It is common to experience some constipation if taking pain medication after surgery.  Increasing fluid intake and taking a stool softener (such as Colace) will usually help or prevent this problem from occurring.  A mild laxative (Milk of Magnesia or Miralax) should be taken according to package instructions if there are no bowel movements after 48 hours. °7. Unless discharge instructions indicate otherwise, you may remove your bandages 24-48 hours after surgery, and you may shower at that time.  You may have steri-strips (small skin tapes) in place directly over the incision.  These strips should be left on the skin for 7-10 days.  If your surgeon used skin glue on the incision, you may shower in 24 hours.  The glue will flake off over the next 2-3 weeks.  Any sutures or  staples will be removed at the office during your follow-up visit. °8. ACTIVITIES:  You may resume regular (light) daily activities beginning the next day--such as daily self-care, walking, climbing stairs--gradually increasing activities as tolerated.  You may have sexual intercourse when it is comfortable.  Refrain from any heavy lifting or straining until approved by your doctor. °a. You may drive when you are no longer taking prescription pain medication, you can comfortably wear a seatbelt, and you can safely maneuver your car and apply brakes. °b. RETURN TO WORK:  __________________________________________________________ °9. You should see your doctor in the office for a follow-up appointment approximately 2-3 weeks after your surgery.  Make sure that you call for this appointment within a day or two after you arrive home to insure a convenient appointment time. °10. OTHER INSTRUCTIONS: __________________________________________________________________________________________________________________________ __________________________________________________________________________________________________________________________ °WHEN TO CALL YOUR DOCTOR: °1. Fever over 101.0 °2. Inability to urinate °3. Continued bleeding from incision. °4. Increased pain, redness, or drainage from the incision. °5. Increasing abdominal pain ° °The clinic staff is available to answer your questions during regular business hours.  Please don’t hesitate to call and ask to speak to one of the nurses for clinical concerns.  If you have a medical emergency, go to the nearest emergency room or call 911.  A surgeon from [facility] is always on call at the hospital. °[address], [address], [place], [place]  [address] ? [address] [address], [place],    [address] °[phone] ? [phone] ? FAX [phone] °Web site:   [email or web address] ° °••••••••• ° ° °Managing Your Pain After Surgery Without  Opioids ° ° ° °Thank you for participating in our program to help patients manage their pain after surgery without opioids. This is part of our effort to provide you with the best care possible, without exposing you or your family to the risk that opioids pose. ° °What pain can I expect after surgery? °You can expect to have some pain after surgery. This is normal. The pain is typically worse the day after surgery, and quickly begins to get better. °Many studies have found that many patients are able to manage their pain after surgery with Over-the-Counter (OTC) medications such as Tylenol and Motrin. If you have a condition that does not allow you to take Tylenol or Motrin, notify your surgical team. ° °How will I manage my pain? °The best strategy for controlling your pain after surgery is around the clock pain control with Tylenol (acetaminophen) and Motrin (ibuprofen or Advil). Alternating these medications with each other allows you to maximize your pain control. In addition to Tylenol and Motrin, you can use heating pads or ice packs on your incisions to help reduce your pain. ° °How will I alternate your regular strength over-the-counter pain medication? °You will take a dose of pain medication every three hours. °; Start by taking 650 mg of Tylenol (2 pills of 325 mg) °; 3 hours later take 600 mg of Motrin (3 pills of 200 mg) °; 3 hours after taking the Motrin take 650 mg of Tylenol °; 3 hours after that take 600 mg of Motrin. ° ° °- 1 - ° °See example - if your first dose of Tylenol is at 12:00 PM ° ° °12:00 PM Tylenol 650 mg (2 pills of 325 mg)  °3:00 PM Motrin 600 mg (3 pills of 200 mg)  °6:00 PM Tylenol 650 mg (2 pills of 325 mg)  °9:00 PM Motrin 600 mg (3 pills of 200 mg)  °Continue alternating every 3 hours  ° °We recommend that you follow this schedule around-the-clock for at least 3 days after surgery, or until you feel that it is no longer needed. Use the table on the last page of this handout to  keep track of the medications you are taking. °Important: °Do not take more than 3000mg of Tylenol or 3200mg of Motrin in a 24-hour period. °Do not take ibuprofen/Motrin if you have a history of bleeding stomach ulcers, severe kidney disease, &/or actively taking a blood thinner ° °What if I still have pain? °If you have pain that is not controlled with the over-the-counter pain medications (Tylenol and Motrin or Advil) you might have what we call “breakthrough” pain. You will receive a prescription for a small amount of an opioid pain medication such as Oxycodone, Tramadol, or Tylenol with Codeine. Use these opioid pills in the first 24 hours after surgery if you have breakthrough pain. Do not take more than 1 pill every 4-6 hours. ° °If you still have uncontrolled pain after using all opioid pills, don't hesitate to call our staff using the number provided. We will help make sure you are managing your pain in the best way possible, and if necessary, we can provide a prescription for additional pain medication. ° ° °Day 1   ° °Time  °Name of Medication Number of pills taken  °Amount of Acetaminophen  °Pain Level  ° °Comments  °AM PM       °AM PM       °  AM PM       °AM PM       °AM PM       °AM PM       °AM PM       °AM PM       °Total Daily amount of Acetaminophen °Do not take more than  3,000 mg per day    ° ° °Day 2   ° °Time  °Name of Medication Number of pills °taken  °Amount of Acetaminophen  °Pain Level  ° °Comments  °AM PM       °AM PM       °AM PM       °AM PM       °AM PM       °AM PM       °AM PM       °AM PM       °Total Daily amount of Acetaminophen °Do not take more than  3,000 mg per day    ° ° °Day 3   ° °Time  °Name of Medication Number of pills taken  °Amount of Acetaminophen  °Pain Level  ° °Comments  °AM PM       °AM PM       °AM PM       °AM PM       ° ° ° °AM PM       °AM PM       °AM PM       °AM PM       °Total Daily amount of Acetaminophen °Do not take more than  3,000 mg per day    ° ° °Day  4   ° °Time  °Name of Medication Number of pills taken  °Amount of Acetaminophen  °Pain Level  ° °Comments  °AM PM       °AM PM       °AM PM       °AM PM       °AM PM       °AM PM       °AM PM       °AM PM       °Total Daily amount of Acetaminophen °Do not take more than  3,000 mg per day    ° ° °Day 5   ° °Time  °Name of Medication Number °of pills taken  °Amount of Acetaminophen  °Pain Level  ° °Comments  °AM PM       °AM PM       °AM PM       °AM PM       °AM PM       °AM PM       °AM PM       °AM PM       °Total Daily amount of Acetaminophen °Do not take more than  3,000 mg per day    ° ° ° °Day 6   ° °Time  °Name of Medication Number of pills °taken  °Amount of Acetaminophen  °Pain Level  °Comments  °AM PM       °AM PM       °AM PM       °AM PM       °AM PM       °AM PM       °AM PM       °AM PM       °Total Daily amount   Total Daily amount of Acetaminophen Do not take more than  3,000 mg per day      Day 7    Time  Name of Medication Number of pills taken  Amount of Acetaminophen  Pain Level   Comments  AM PM       AM PM       AM PM       AM PM       AM PM       AM PM       AM PM       AM PM       Total Daily amount of Acetaminophen Do not take more than  3,000 mg per day        For additional information about how and where to safely dispose of unused opioid medications - [email or web address]  Disclaimer: This document contains information and/or instructional materials adapted from [facility] for the typical patient with your condition. It does not replace medical advice from your health care provider because your experience may differ from that of the typical patient. Talk to your health care provider if you have any questions about this document, your condition or your treatment plan. Adapted from [facility]    Post Anesthesia Home Care Instructions  Activity: Get plenty of rest for the remainder of the day. A responsible individual must stay with  you for 24 hours following the procedure.  For the next 24 hours, DO NOT: -Drive a car -Advertising copywriter -Drink alcoholic beverages -Take any medication unless instructed by your physician -Make any legal decisions or sign important papers.  Meals: Start with liquid foods such as gelatin or soup. Progress to regular foods as tolerated. Avoid greasy, spicy, heavy foods. If nausea and/or vomiting occur, drink only clear liquids until the nausea and/or vomiting subsides. Call your physician if vomiting continues.  Special Instructions/Symptoms: Your throat may feel dry or sore from the anesthesia or the breathing tube placed in your throat during surgery. If this causes discomfort, gargle with warm salt water. The discomfort should disappear within 24 hours.  If you had a scopolamine patch placed behind your ear for the management of post- operative nausea and/or vomiting:  1. The medication in the patch is effective for 72 hours, after which it should be removed.  Wrap patch in a tissue and discard in the trash. Wash hands thoroughly with soap and water. 2. You may remove the patch earlier than 72 hours if you experience unpleasant side effects which may include dry mouth, dizziness or visual disturbances. 3. Avoid touching the patch. Wash your hands with soap and water after contact with the patch.

## 2018-11-23 ENCOUNTER — Encounter (HOSPITAL_COMMUNITY): Payer: Self-pay | Admitting: General Surgery

## 2018-12-02 ENCOUNTER — Encounter (HOSPITAL_COMMUNITY): Payer: Self-pay | Admitting: General Surgery

## 2019-02-04 DIAGNOSIS — M109 Gout, unspecified: Secondary | ICD-10-CM | POA: Diagnosis not present

## 2019-02-07 IMAGING — DX DG CHEST 2V
2 series · 2 of 2 positions shown · non-contrast
Comparison: 12/19/2013

CLINICAL DATA: Severe chest/epigastric pain

EXAM:
CHEST  2 VIEW

[chest pa]
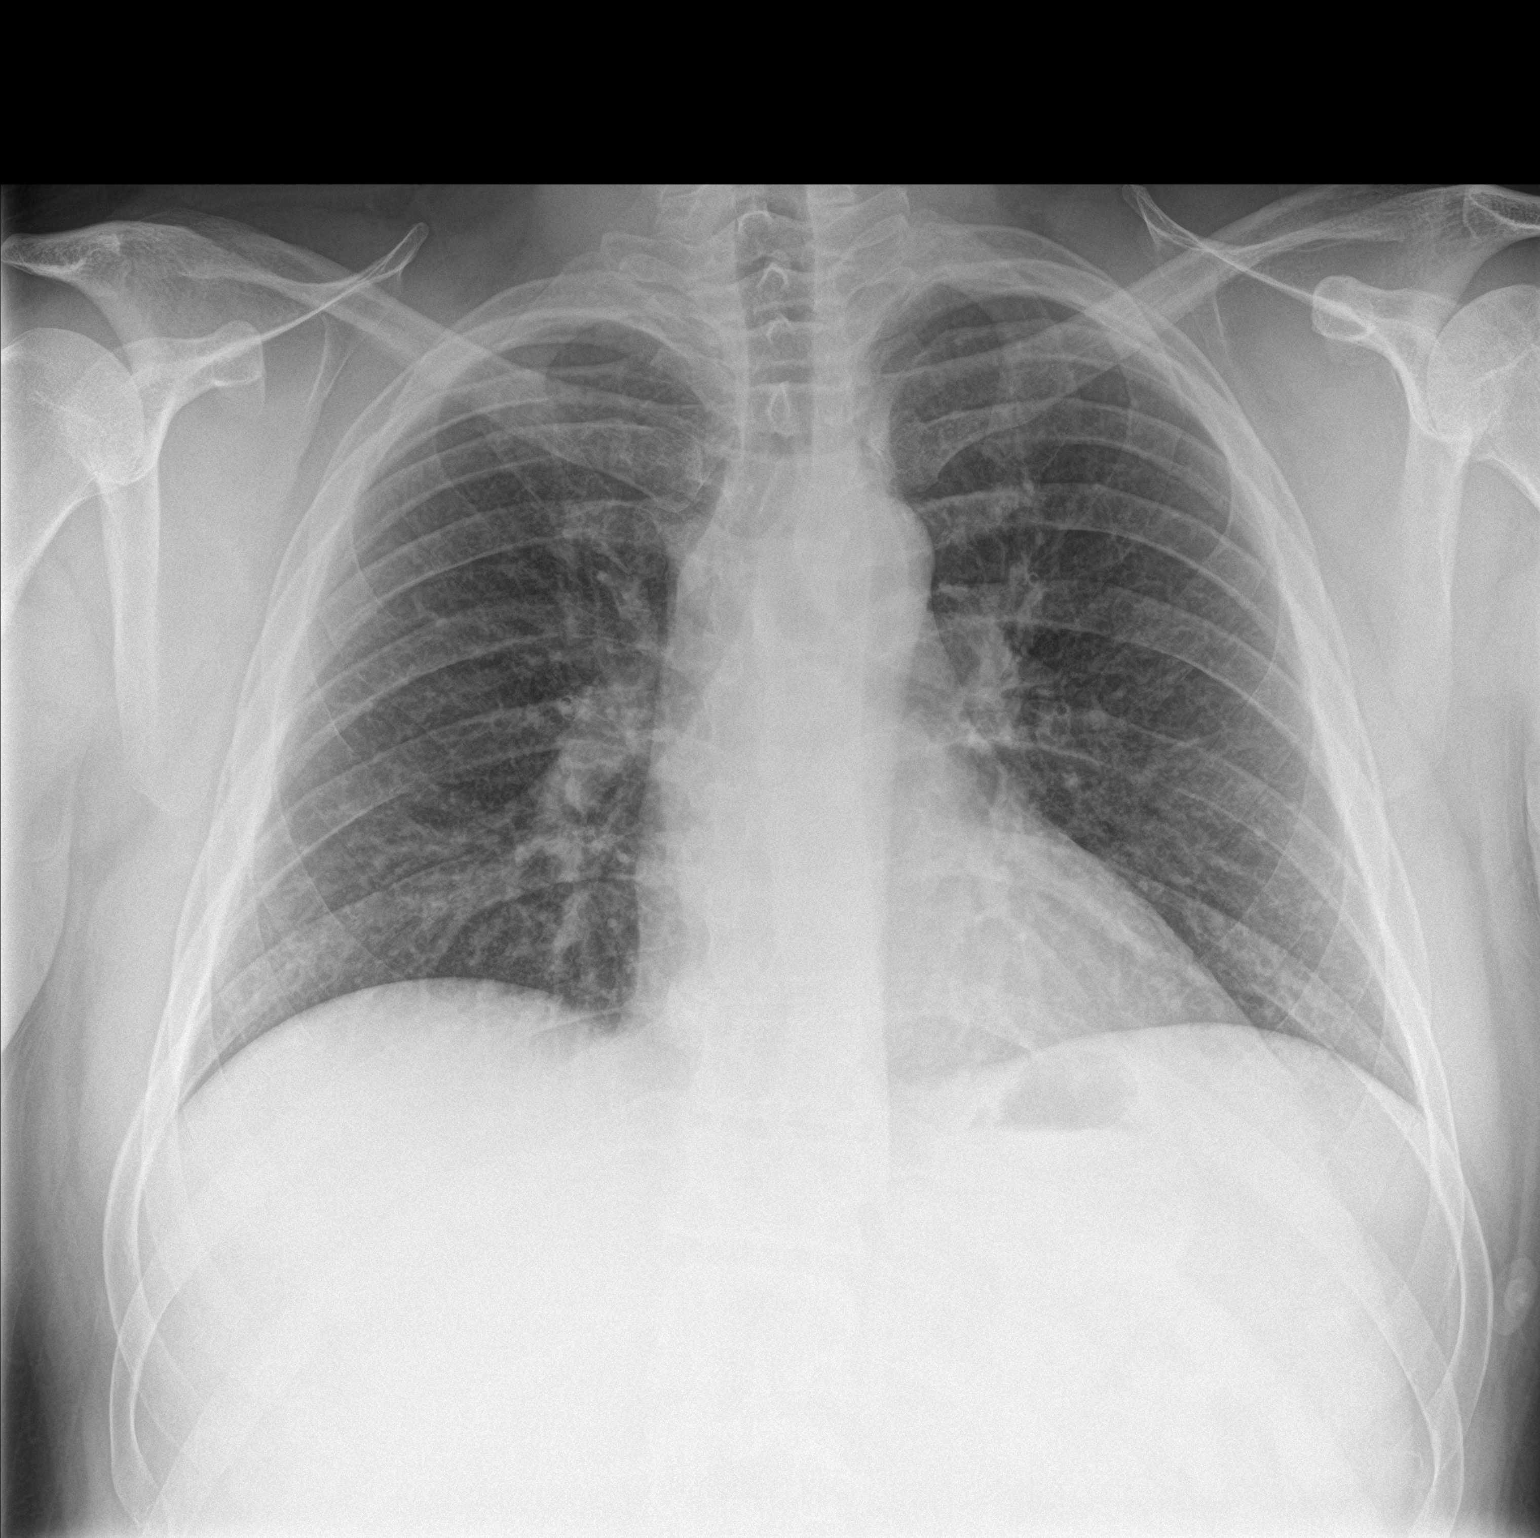

[chest lat]
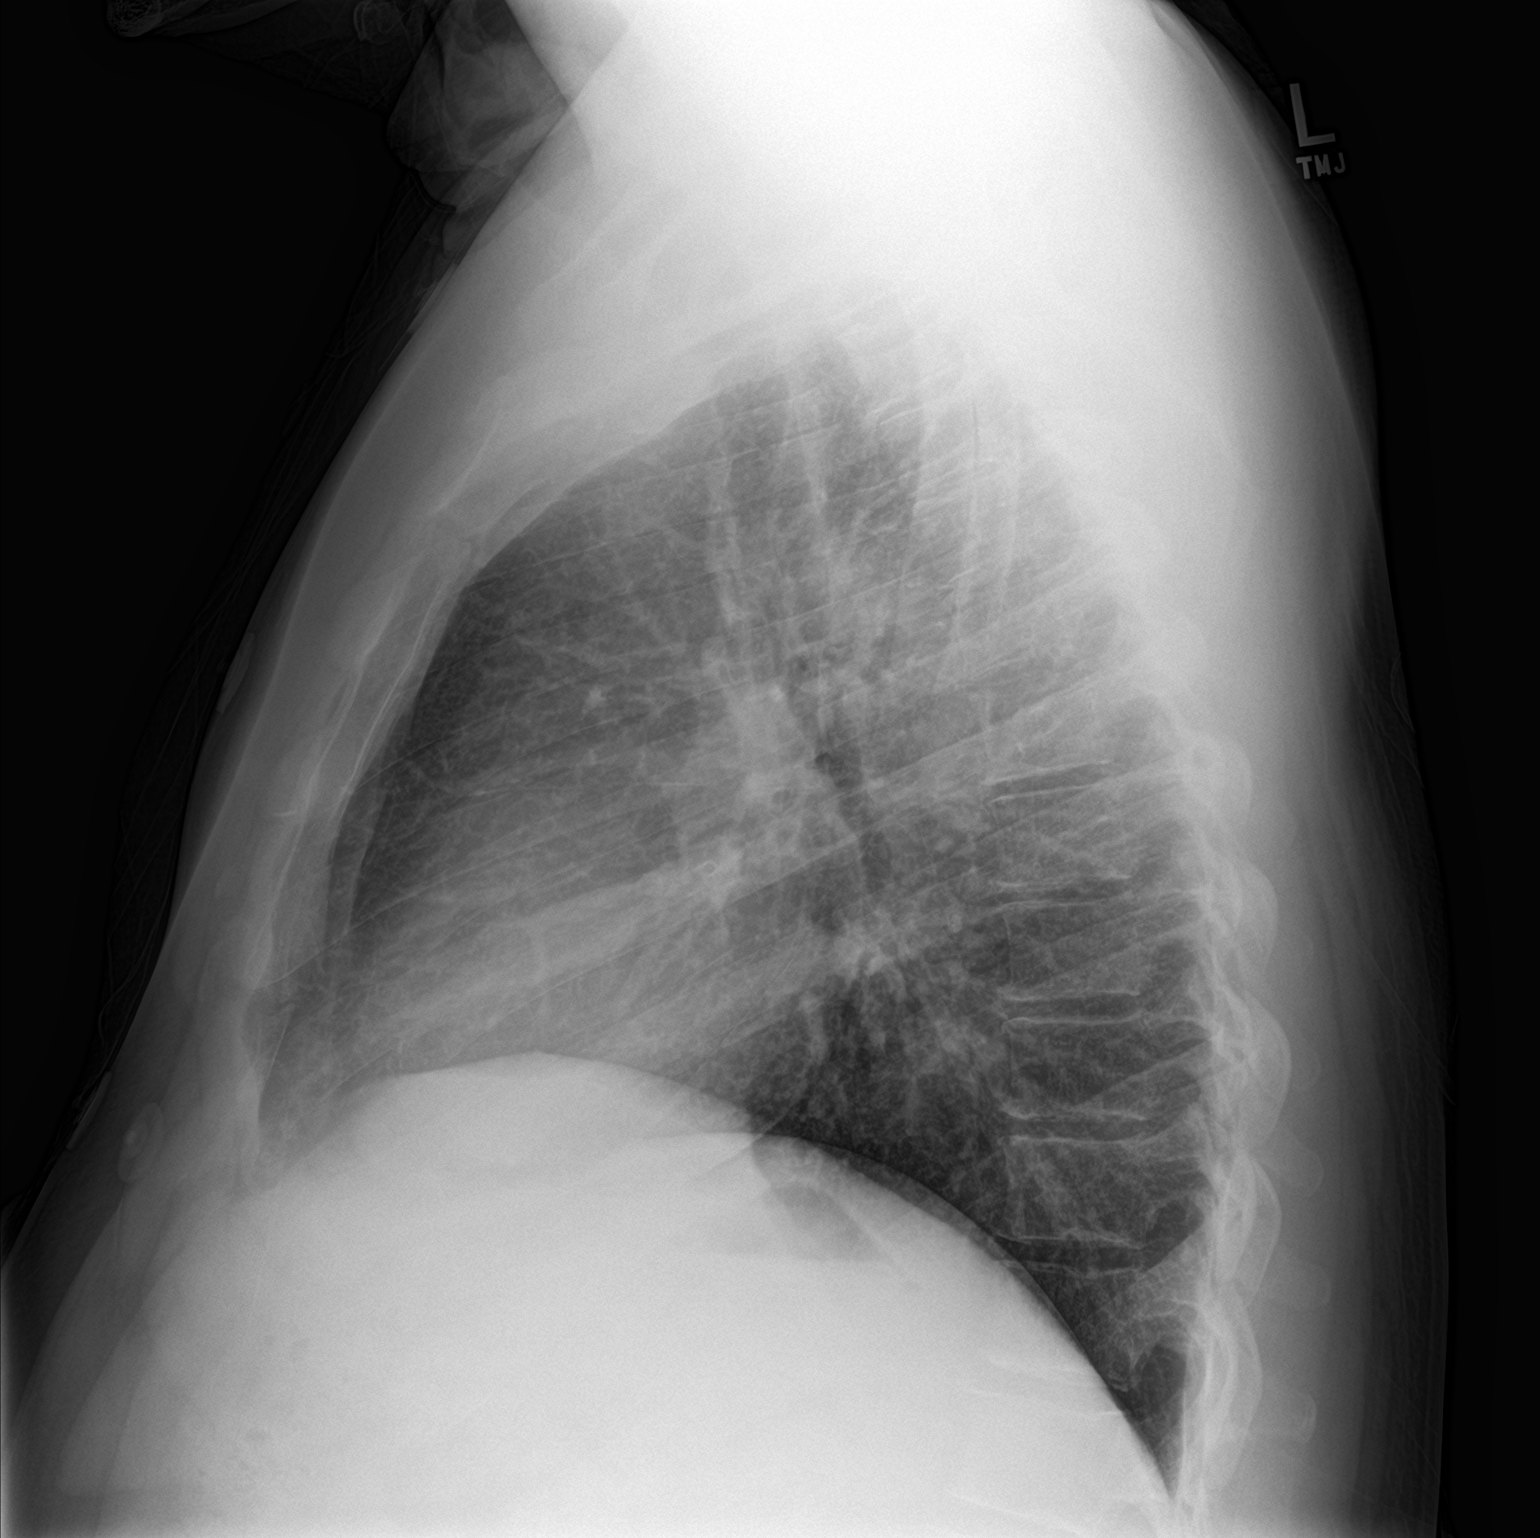

[2 of 2 positions shown; findings below may reference images not displayed]

FINDINGS: Lungs are clear.  No pleural effusion or pneumothorax.

The heart is normal in size.

Visualized osseous structures are within normal limits.
IMPRESSION: Normal chest radiographs.

## 2019-02-07 IMAGING — CT CT ABD-PELV W/ CM
2 of 5 series · 16 of 46 positions shown, 18 images · IV contrast (iopamidol)
Comparison: 12/27/2010, ultrasound 09/02/2016

CLINICAL DATA: Abdomen pain with nausea and vomit

EXAM:
CT ABDOMEN AND PELVIS WITH CONTRAST
TECHNIQUE: Multidetector CT imaging of the abdomen and pelvis was performed
using the standard protocol following bolus administration of
intravenous contrast.
CONTRAST:  100mL WNFUMC-JDD IOPAMIDOL (WNFUMC-JDD) INJECTION 61%

[Series 3: abd/ pelvis 5.0 i30f 2 · axial · 0.89mm/px · z∈[-975,-500]mm · 13 of 107 slices shown, 15 images]
[im 6/107  soft-tissue]
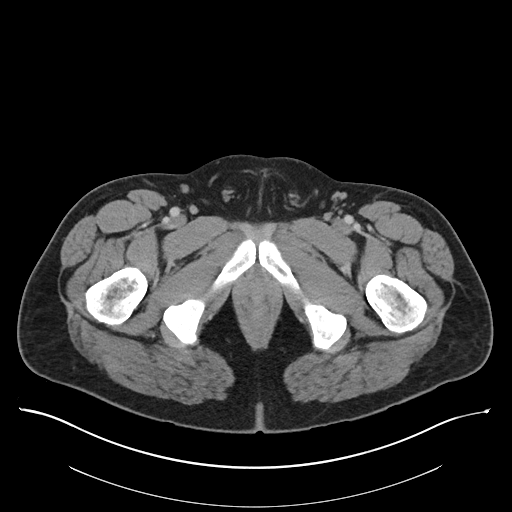
[im 6/107  bone]
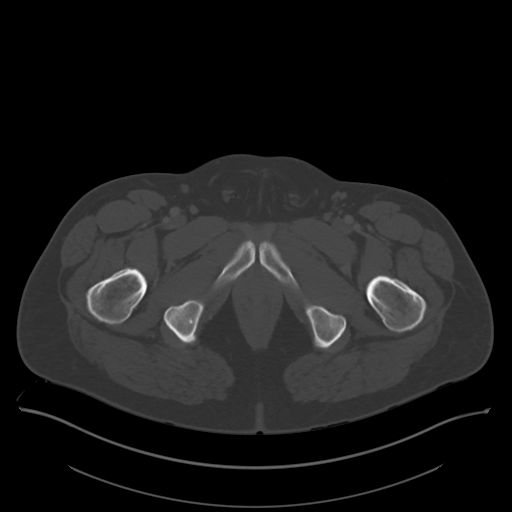
[im 17/107  soft-tissue]
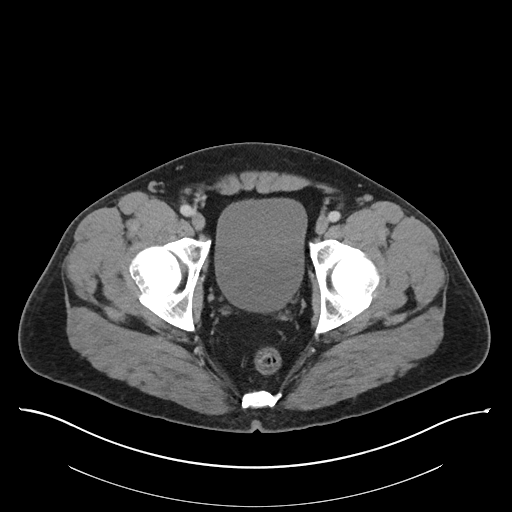
[im 23/107  soft-tissue]
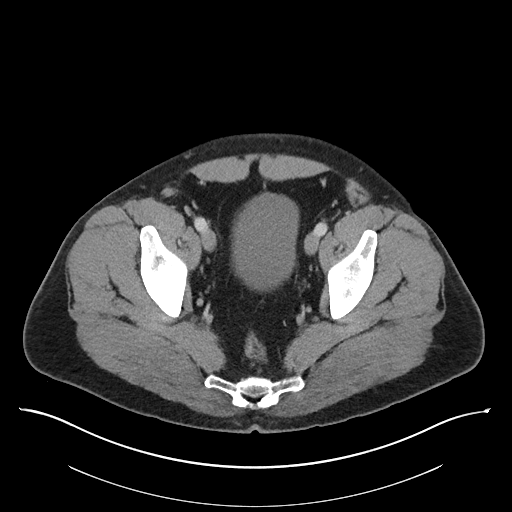
[im 28/107  soft-tissue]
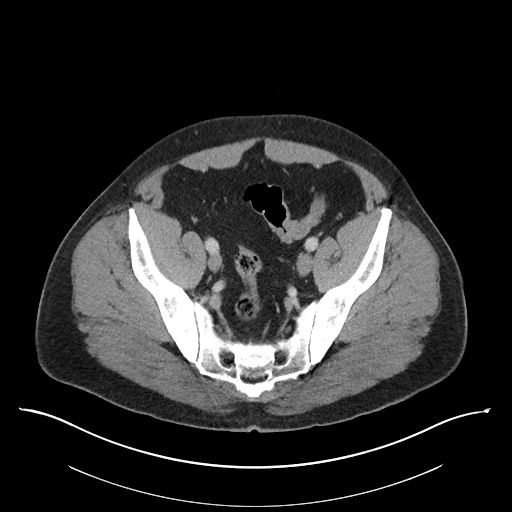
[im 40/107  soft-tissue]
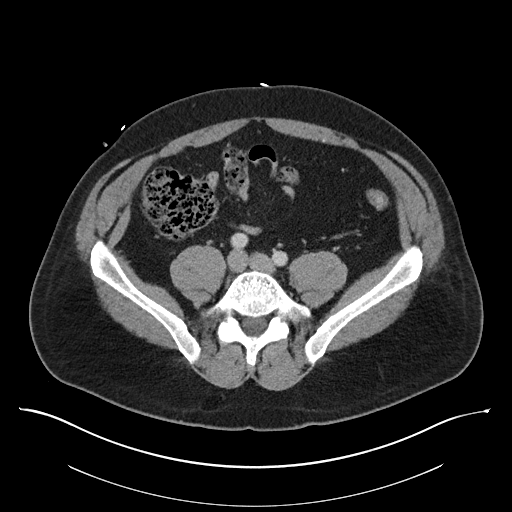
[im 45/107  soft-tissue]
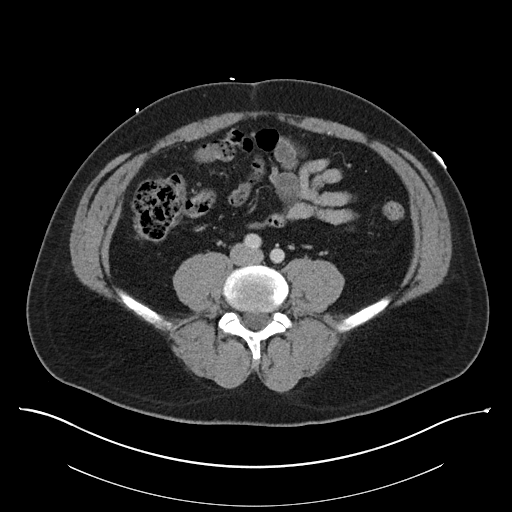
[im 56/107  soft-tissue]
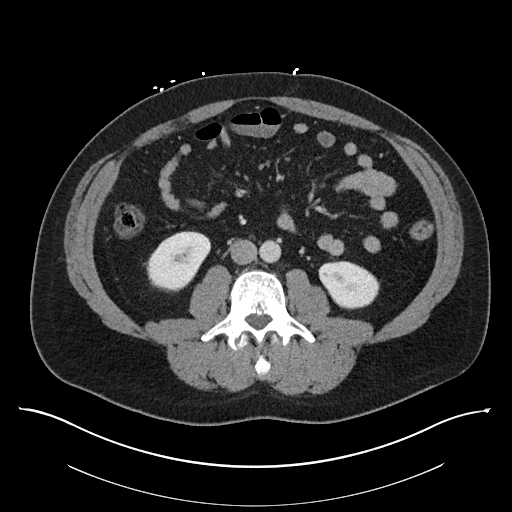
[im 62/107  soft-tissue]
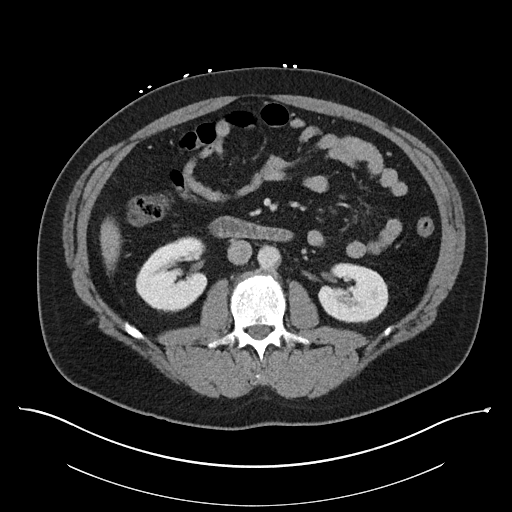
[im 67/107  soft-tissue]
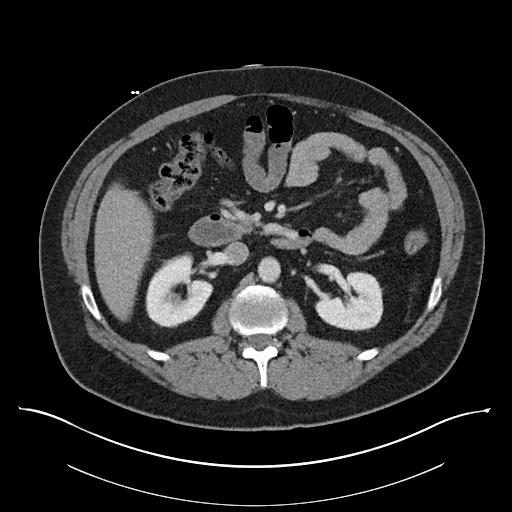
[im 67/107  bone]
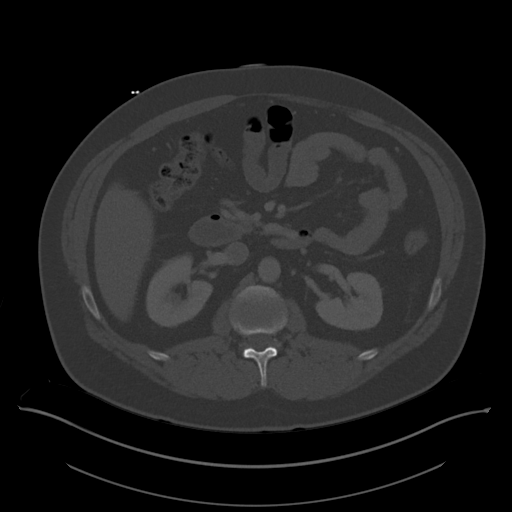
[im 79/107  soft-tissue]
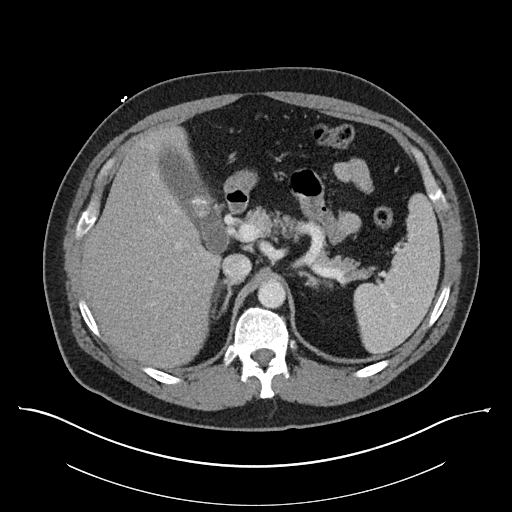
[im 84/107  soft-tissue]
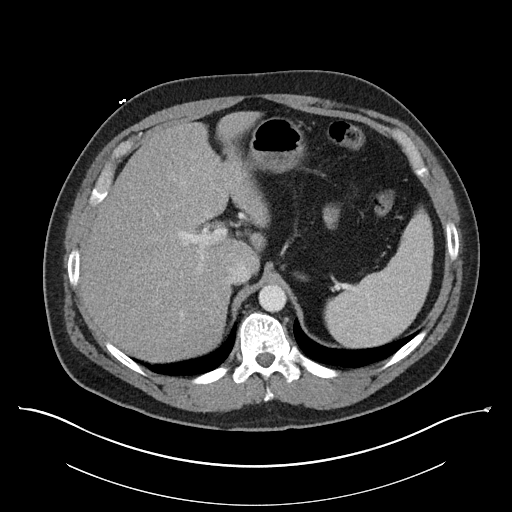
[im 90/107  soft-tissue]
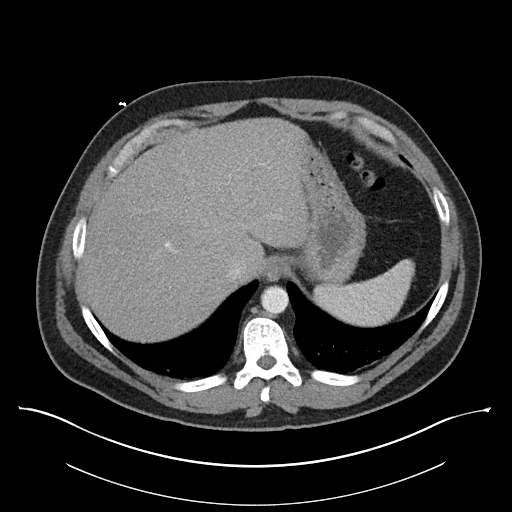
[im 101/107  soft-tissue]
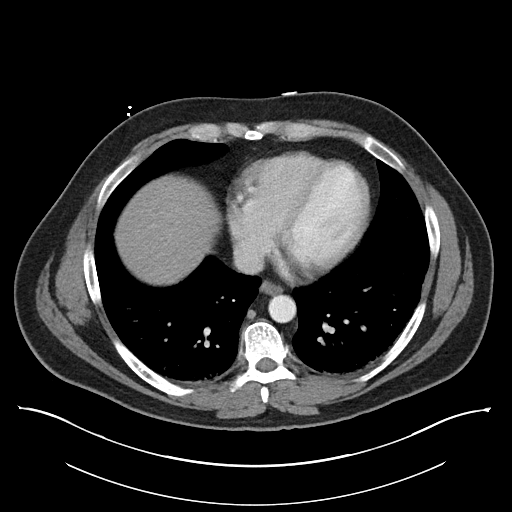

[Series 5: coronal soft tissue · coronal · 0.88mm/px · 3 of 107 slices shown]
[im 36/107  soft-tissue]
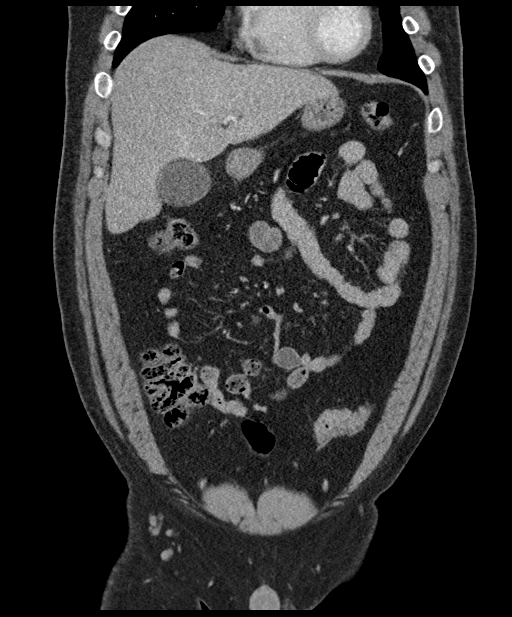
[im 48/107  soft-tissue]
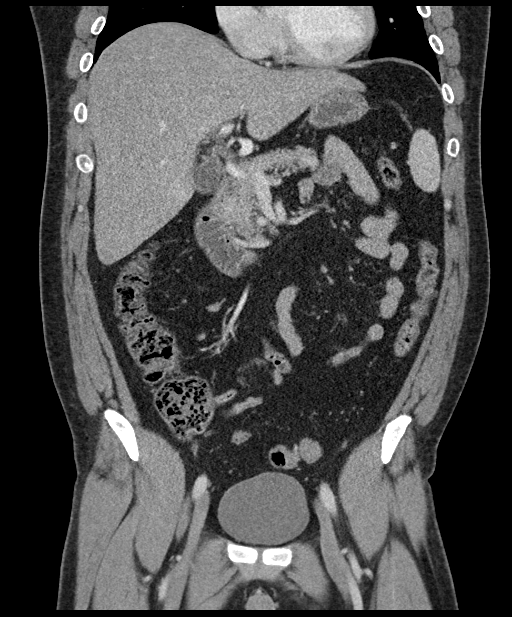
[im 59/107  soft-tissue]
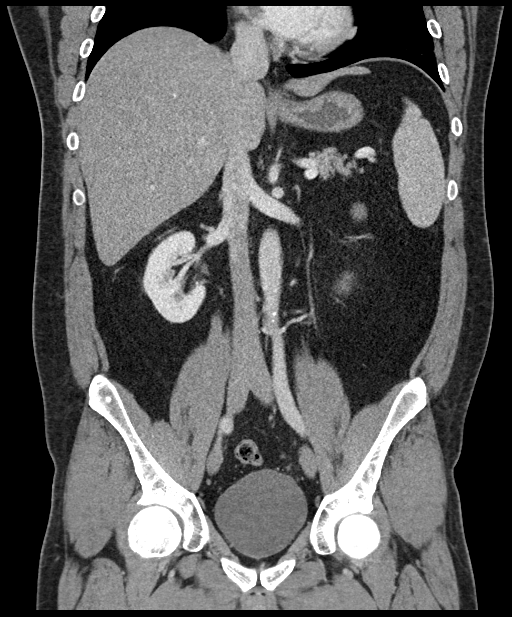

[16 of 46 positions shown; findings below may reference images not displayed]

FINDINGS: Lower chest: Patchy dependent atelectasis. No consolidation or
pleural effusion. Heart size upper normal.

Hepatobiliary: Mild steatosis. No focal hepatic abnormality.
Slightly enlarged gallbladder contains a partially calcified gas
containing stone. No wall thickening. No biliary dilatation.

Pancreas: Unremarkable. No pancreatic ductal dilatation or
surrounding inflammatory changes.

Spleen: Normal in size without focal abnormality.

Adrenals/Urinary Tract: Adrenal glands are unremarkable. Kidneys are
normal, without renal calculi, focal lesion, or hydronephrosis.
Bladder is unremarkable.

Stomach/Bowel: Stomach is within normal limits. Appendix appears
normal. No evidence of bowel wall thickening, distention, or
inflammatory changes.

Vascular/Lymphatic: No significant vascular findings are present. No
enlarged abdominal or pelvic lymph nodes.

Reproductive: Prostate contains mild calcification. No obvious mass.

Other: Tiny fat in the umbilicus.  No abdominopelvic ascites.

Musculoskeletal: No acute or significant osseous findings.
IMPRESSION: 1. Gallstone.  No wall thickening or biliary dilatation.
2. Mild hepatic steatosis
3. No acute intra-abdominal or pelvic abnormality. Normal appendix.
No evidence for a bowel obstruction.

## 2019-03-12 DIAGNOSIS — M109 Gout, unspecified: Secondary | ICD-10-CM | POA: Diagnosis not present

## 2019-04-11 ENCOUNTER — Other Ambulatory Visit: Payer: Self-pay

## 2019-04-11 DIAGNOSIS — Z20822 Contact with and (suspected) exposure to covid-19: Secondary | ICD-10-CM

## 2019-04-13 ENCOUNTER — Encounter: Payer: Self-pay | Admitting: Adult Health

## 2019-04-13 LAB — NOVEL CORONAVIRUS, NAA: SARS-CoV-2, NAA: DETECTED — AB

## 2019-07-12 ENCOUNTER — Telehealth: Payer: Self-pay

## 2019-07-12 NOTE — Telephone Encounter (Signed)
Copied from CRM [id number]. Topic: General - Other >> Jul 12, 2019 11:02 AM Jaquita Rector A wrote: Reason for CRM: Patient called to say that he is in need of a physical exam but that it has to be [employer] approved and he want to know if that can be done here. Asking for a call back with an answer please. Ph# [phone]

## 2019-07-14 NOTE — Telephone Encounter (Signed)
Called pt to see if a specific form needs to filled out to be [employer] approved. Pt stated that he will call the [employer] to see and call back shortly.

## 2019-07-20 NOTE — Telephone Encounter (Signed)
Spoke to pt and he stated he has been scheduled.

## 2019-08-09 ENCOUNTER — Other Ambulatory Visit: Payer: Self-pay

## 2019-08-10 ENCOUNTER — Encounter: Payer: Self-pay | Admitting: Adult Health

## 2019-08-10 ENCOUNTER — Ambulatory Visit (INDEPENDENT_AMBULATORY_CARE_PROVIDER_SITE_OTHER): Payer: PRIVATE HEALTH INSURANCE | Admitting: Adult Health

## 2019-08-10 VITALS — BP 100/82 | HR 75 | Temp 97.0°F | Ht 74.75 in | Wt 250.0 lb

## 2019-08-10 DIAGNOSIS — Z0000 Encounter for general adult medical examination without abnormal findings: Secondary | ICD-10-CM

## 2019-08-10 DIAGNOSIS — M1A4710 Other secondary chronic gout, right ankle and foot, without tophus (tophi): Secondary | ICD-10-CM

## 2019-08-10 DIAGNOSIS — F17200 Nicotine dependence, unspecified, uncomplicated: Secondary | ICD-10-CM | POA: Diagnosis not present

## 2019-08-10 MED ORDER — COLCHICINE 0.6 MG PO TABS: ORAL_TABLET | ORAL | 1 refills | Status: DC

## 2019-08-10 MED ORDER — ALLOPURINOL 300 MG PO TABS: 300.0000 mg | ORAL_TABLET | Freq: Every day | ORAL | 1 refills | Status: DC

## 2019-08-10 NOTE — Patient Instructions (Signed)
It was great seeing you today   I have increased your allopurinol from 100 mg to 300 mg daily. Let me know if this cuts back on the amount of gout flares you experience.   I have also sent in colchicine to take if you develop a gout flare .   Please schedule your labs

## 2019-08-10 NOTE — Progress Notes (Signed)
Subjective:    Patient ID: Jonathan Vaughn, male    DOB: [birth year], 44 y.o.   MRN: [id number]  HPI Patient presents for yearly preventative medicine examination. 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  Does need some paperwork filled out for the [facility]  Gout - He is currently prescribed Allopurinol 100 mg daily. He reports that he continues to have frequent gout flares. Most recent one was two weeks ago in his right great toe. He has started changing his diet.   Tobacco Use - has quit smoking. He quit cold [place] after taking scuba lessons.   All immunizations and health maintenance protocols were reviewed with the patient and needed orders were placed.  Refuses tetanus and flu vaccination  Appropriate screening laboratory values were ordered for the patient including screening of hyperlipidemia, renal function and hepatic function.   Medication reconciliation,  past medical history, social history, problem list and allergies were reviewed in detail with the patient  Goals were established with regard to weight loss, exercise, and  diet in compliance with medications.  He is working on [employer] but does not do exercise on a routine basis. Wt Readings from Last 3 Encounters:  08/10/19 250 lb (113.4 kg)  11/22/18 254 lb 12.8 oz (115.6 kg)  11/19/18 254 lb 12.8 oz (115.6 kg)   He has no acute complaints today  Review of Systems  Constitutional: Negative.   HENT: Negative.   Eyes: Negative.   Respiratory: Negative.   Cardiovascular: Negative.   Gastrointestinal: Negative.   Endocrine: Negative.    Genitourinary: Negative.   Musculoskeletal: Negative.   Skin: Negative.   Allergic/Immunologic: Negative.   Neurological: Negative.   Hematological: Negative.   Psychiatric/Behavioral: Negative.   All other systems reviewed and are negative.  Past Medical History:  Diagnosis Date  . 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 GERD 03/21/2010   Qualifier: Diagnosis of  By: Nelson-Smith CMA (AAMA), Dottie    . Gout   . Headache(784.0) 01/10/2009   Qualifier: Diagnosis of  By: Gabriel Rung LPN, Harriett Sine    . Incarcerated incisional hernia 11/22/2018  . LATERAL EPICONDYLITIS OF ELBOW 08/03/2009   Qualifier: Diagnosis of  By: Caryl Never MD, Bruce    . SHOULDER PAIN 01/23/2010   Qualifier: Diagnosis of  By: Gabriel Rung LPN, Harriett Sine      Social History   Socioeconomic History  . Marital status: Married    Spouse name: Not on file  . Number of children: Not on file  . Years of education: Not on file  . Highest education level: Not on file  Occupational History  . Not on file  Tobacco Use  . Smoking status: Former Smoker    Packs/day: 3.00    Years: 15.00    Pack years: 45.00    Types: Cigarettes    Quit date: 06/23/2016    Years since quitting: 3.1  . Smokeless tobacco: Never Used  Substance and Sexual Activity  . Alcohol use: Not Currently    Comment: pt states he no longer drinks- quit in 2018  . Drug use: No  . Sexual activity: Not on  file  Other Topics Concern  . Not on file  Social History Narrative   He is self employed - Corporate investment banker    Married    Has children       Social Determinants of Corporate investment banker Strain:   . Difficulty of Paying Living Expenses: Not on file  Food Insecurity:   . Worried About Programme researcher, broadcasting/film/video in the Last Year: Not on file  . Ran Out of Food in the Last Year: Not on file  Transportation Needs:   . Lack of Transportation (Medical): Not on file  . Lack of Transportation (Non-Medical): Not on file  Physical Activity:   . Days of  Exercise per Week: Not on file  . Minutes of Exercise per Session: Not on file  Stress:   . Feeling of Stress : Not on file  Social Connections:   . Frequency of Communication with Friends and Family: Not on file  . Frequency of Social Gatherings with Friends and Family: Not on file  . Attends Religious Services: Not on file  . Active Member of Clubs or Organizations: Not on file  . Attends Banker Meetings: Not on file  . Marital Status: Not on file  Intimate Partner Violence:   . Fear of Current or Ex-Partner: Not on file  . Emotionally Abused: Not on file  . Physically Abused: Not on file  . Sexually Abused: Not on file    Past Surgical History:  Procedure Laterality Date  . CHOLECYSTECTOMY N/A 09/03/2016   Procedure: LAPAROSCOPIC CHOLECYSTECTOMY;  Surgeon: Emelia Loron, MD;  Location[facility] OR;  Service: General;  Laterality: N/A;  . INSERTION OF MESH N/A 11/22/2018   Procedure: Insertion Of Mesh;  Surgeon: Claud Kelp, MD;  Location: [facility] OR;  Service: General;  Laterality: N/A;  . TONSILLECTOMY    . VENTRAL HERNIA REPAIR N/A 11/22/2018   Procedure: LAPAROSCOPIC REPAIR VENTRAL HERNIA WITH MESH;  Surgeon: Claud Kelp, MD;  Location: [facility] OR;  Service: General;  Laterality: N/A;    Family History  Problem Relation Age of Onset  . Crohn's disease Other   . Gout Father     Allergies  Allergen Reactions  . Azithromycin Rash    Current Outpatient Medications on File Prior to Visit  Medication Sig Dispense Refill  . HYDROcodone-acetaminophen (NORCO) 5-325 MG tablet Take 1-2 tablets by mouth every 6 (six) hours as needed for moderate pain or severe pain. 30 tablet 0  . Omega-3 Fatty Acids (FISH OIL PO) Take 1 capsule by mouth 4 (four) times a week.     No current facility-administered medications on file prior to visit.    BP 100/82   Pulse 75   Temp (!) 97 F (36.1 C) (Other (Comment))   Ht 6' 2.75" (1.899 m)   Wt 250 lb (113.4 kg)   SpO2 99%   BMI  31.46 kg/m       Objective:   Physical Exam Constitutional:      General: He is not in acute distress.    Appearance: He is well-developed and overweight.  HENT:     Head: Normocephalic and atraumatic.     Right Ear: Tympanic membrane, ear canal and external ear normal. There is no impacted cerumen.     Left Ear: Tympanic membrane, ear canal and external ear normal. There is no impacted cerumen.     Nose: Congestion present. No rhinorrhea.     Mouth/Throat:     Mouth: Mucous  membranes are moist.     Pharynx: Oropharynx is clear. No oropharyngeal exudate or posterior oropharyngeal erythema.  Eyes:     General:        Right eye: No discharge.        Left eye: No discharge.  Neck:     Trachea: No tracheal deviation.  Cardiovascular:     Rate and Rhythm: Normal rate.     Pulses: Normal pulses.     Heart sounds: Normal heart sounds. No murmur. No friction rub. No gallop.   Pulmonary:     Effort: Pulmonary effort is normal. No respiratory distress.     Breath sounds: Normal breath sounds. No stridor. No wheezing, rhonchi or rales.  Chest:     Chest wall: No tenderness.  Abdominal:     General: Bowel sounds are normal. There is no distension.     Palpations: Abdomen is soft. There is no mass.     Tenderness: There is no abdominal tenderness. There is no right CVA tenderness, left CVA tenderness, guarding or rebound.     Hernia: No hernia is present.  Musculoskeletal:        General: No swelling, tenderness, deformity or signs of injury. Normal range of motion.     Right lower leg: No edema.     Left lower leg: No edema.  Lymphadenopathy:     Cervical: No cervical adenopathy.  Skin:    General: Skin is warm and dry.     Capillary Refill: Capillary refill takes less than 2 seconds.     Coloration: Skin is not jaundiced or pale.     Findings: No bruising, erythema, lesion or rash.  Neurological:     General: No focal deficit present.     Mental Status: He is alert and  oriented to person, place, and time.     Cranial Nerves: No cranial nerve deficit.     Sensory: No sensory deficit.     Motor: No weakness.     Coordination: Coordination normal.     Gait: Gait normal.     Deep Tendon Reflexes: Reflexes normal.  Psychiatric:        Mood and Affect: Mood normal.        Behavior: Behavior normal.        Thought Content: Thought content normal.        Judgment: Judgment normal.        Assessment & Plan:  1. Routine general medical examination at a health care facility -Encouraged heart healthy diet and frequent exercise. -Follow-up in 1 year or sooner if needed - CBC with Differential/Platelet; Future - Comprehensive metabolic panel; Future - Hemoglobin A1c; Future - Lipid panel; Future - TSH; Future  2. Tobacco use disorder -Congratulated on quitting smoking  3. 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  He was advised to follow-up if he continues to have recurrent gout flares at which time we will refer to rheumatology - Uric Acid; Future - allopurinol (ZYLOPRIM) 300 MG tablet; Take 1 tablet (300 mg total) by mouth daily.  Dispense: 90 tablet; Refill: 1 - colchicine 0.6 MG tablet; Take two tabs on thew first day and then 1 tab daily until gout flare has resolved.  Dispense: 30 tablet; Refill: 1  Dorothyann Peng, NP

## 2019-08-17 ENCOUNTER — Other Ambulatory Visit: Payer: Self-pay

## 2019-08-17 ENCOUNTER — Other Ambulatory Visit (INDEPENDENT_AMBULATORY_CARE_PROVIDER_SITE_OTHER): Payer: PRIVATE HEALTH INSURANCE

## 2019-08-17 DIAGNOSIS — Z0000 Encounter for general adult medical examination without abnormal findings: Secondary | ICD-10-CM | POA: Diagnosis not present

## 2019-08-17 DIAGNOSIS — M1A4710 Other secondary chronic gout, right ankle and foot, without tophus (tophi): Secondary | ICD-10-CM

## 2019-08-17 LAB — COMPREHENSIVE METABOLIC PANEL
ALT: 54 U/L — ABNORMAL HIGH (ref 0–53)
AST: 20 U/L (ref 0–37)
Albumin: 4.3 g/dL (ref 3.5–5.2)
Alkaline Phosphatase: 91 U/L (ref 39–117)
BUN: 16 mg/dL (ref 6–23)
CO2: 26 mEq/L (ref 19–32)
Calcium: 9.5 mg/dL (ref 8.4–10.5)
Chloride: 102 mEq/L (ref 96–112)
Creatinine, Ser: 1.05 mg/dL (ref 0.40–1.50)
GFR: 76.65 mL/min (ref >60.00)
Glucose, Bld: 116 mg/dL — ABNORMAL HIGH (ref 70–99)
Potassium: 4.4 mEq/L (ref 3.5–5.1)
Sodium: 137 mEq/L (ref 135–145)
Total Bilirubin: 0.4 mg/dL (ref 0.2–1.2)
Total Protein: 6.9 g/dL (ref 6.0–8.3)

## 2019-08-17 LAB — LDL CHOLESTEROL, DIRECT: Direct LDL: 147 mg/dL

## 2019-08-17 LAB — CBC WITH DIFFERENTIAL/PLATELET
Basophils Absolute: 0.1 10*3/uL (ref 0.0–0.1)
Basophils Relative: 1.1 % (ref 0.0–3.0)
Eosinophils Absolute: 0.2 10*3/uL (ref 0.0–0.7)
Eosinophils Relative: 2.6 % (ref 0.0–5.0)
HCT: 45.5 % (ref 39.0–52.0)
Hemoglobin: 15.3 g/dL (ref 13.0–17.0)
Lymphocytes Relative: 31 % (ref 12.0–46.0)
Lymphs Abs: 2.1 10*3/uL (ref 0.7–4.0)
MCHC: 33.5 g/dL (ref 30.0–36.0)
MCV: 87.3 fl (ref 78.0–100.0)
Monocytes Absolute: 0.5 10*3/uL (ref 0.1–1.0)
Monocytes Relative: 7 % (ref 3.0–12.0)
Neutro Abs: 4 10*3/uL (ref 1.4–7.7)
Neutrophils Relative %: 58.3 % (ref 43.0–77.0)
Platelets: 223 10*3/uL (ref 150.0–400.0)
RBC: 5.21 Mil/uL (ref 4.22–5.81)
RDW: 13.7 % (ref 11.5–15.5)
WBC: 6.8 10*3/uL (ref 4.0–10.5)

## 2019-08-17 LAB — LIPID PANEL
Cholesterol: 216 mg/dL — ABNORMAL HIGH (ref 0–200)
HDL: 36 mg/dL — ABNORMAL LOW (ref >39.00)
NonHDL: 180.35
Total CHOL/HDL Ratio: 6
Triglycerides: 233 mg/dL — ABNORMAL HIGH (ref 0.0–149.0)
VLDL: 46.6 mg/dL — ABNORMAL HIGH (ref 0.0–40.0)

## 2019-08-17 LAB — TSH: TSH: 4.93 u[IU]/mL — ABNORMAL HIGH (ref 0.35–4.50)

## 2019-08-17 LAB — URIC ACID: Uric Acid, Serum: 8 mg/dL — ABNORMAL HIGH (ref 4.0–7.8)

## 2019-08-18 LAB — HEMOGLOBIN A1C: Hgb A1c MFr Bld: 5.7 % (ref 4.6–6.5)

## 2019-08-23 ENCOUNTER — Other Ambulatory Visit: Payer: Self-pay | Admitting: Family Medicine

## 2019-08-23 MED ORDER — ATORVASTATIN CALCIUM 20 MG PO TABS: 20.0000 mg | ORAL_TABLET | Freq: Every day | ORAL | 3 refills | Status: DC

## 2019-09-05 ENCOUNTER — Telehealth: Payer: Self-pay | Admitting: Adult Health

## 2019-09-05 NOTE — Telephone Encounter (Signed)
Spoke with patient 09/05/2019 and let the patient know that his forms are ready for pickup.   The patient will be by sometime this week to pick up his forms.

## 2019-10-25 ENCOUNTER — Ambulatory Visit: Payer: PRIVATE HEALTH INSURANCE | Admitting: Adult Health

## 2019-10-25 DIAGNOSIS — Z0289 Encounter for other administrative examinations: Secondary | ICD-10-CM

## 2019-10-25 DIAGNOSIS — E782 Mixed hyperlipidemia: Secondary | ICD-10-CM | POA: Insufficient documentation

## 2019-10-25 NOTE — Progress Notes (Deleted)
Subjective:    Patient ID: Jonathan Vaughn, male    DOB: Feb 18, 1976, 44 y.o.   MRN: [id number]  HPI 44 year old male who is being evaluated today for follow-up regarding hyperlipidemia and elevated liver enzymes.  In February 2021 during his physical his labs showed elevated cholesterol levels/hypertriglyceridemia with elevation in his ALT.  He was subsequently started on Lipitor 20 mg.  He reports that since starting Lipitor he has not noticed any fatigue or myalgia.  Lab Results  Component Value Date   CHOL 216 (H) 08/17/2019   HDL 36.00 (L) 08/17/2019   LDLDIRECT 147.0 08/17/2019   TRIG 233.0 (H) 08/17/2019   CHOLHDL 6 08/17/2019   Lab Results  Component Value Date   ALT 54 (H) 08/17/2019   AST 20 08/17/2019   ALKPHOS 91 08/17/2019   BILITOT 0.4 08/17/2019    Review of Systems See HPI   Past Medical History:  Diagnosis Date  . 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 GERD 03/21/2010   Qualifier: Diagnosis of  By: Nelson-Smith CMA (AAMA), Dottie    . Gout   . Headache(784.0) 01/10/2009   Qualifier: Diagnosis of  By: Valma Cava LPN, Izora Gala    . Incarcerated incisional hernia 11/22/2018  . LATERAL EPICONDYLITIS OF ELBOW 08/03/2009   Qualifier: Diagnosis of  By: Elease Hashimoto MD, Bruce    . SHOULDER PAIN 01/23/2010   Qualifier: Diagnosis of  By: Valma Cava LPN, Izora Gala      Social History   Socioeconomic History  . Marital status: Married    Spouse name: Not on file  . Number of children: Not on file  . Years of education: Not on file  . Highest education level: Not on file  Occupational History  . Not on file  Tobacco Use  . Smoking status: Former Smoker    Packs/day: 3.00    Years: 15.00    Pack years: 45.00    Types: Cigarettes    Quit date: 06/23/2016    Years since quitting: 3.3  . Smokeless tobacco: Never Used  Substance and Sexual Activity  . Alcohol use: Not Currently    Comment: pt states he no longer drinks- quit in 2018  . Drug use: No  . Sexual activity: Not  on file  Other Topics Concern  . Not on file  Social History Narrative   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    Married    Has children       Social Determinants of Radio broadcast assistant Strain:   . Difficulty of Paying Living Expenses:   Food Insecurity:   . Worried About Charity fundraiser in the Last Year:   . Arboriculturist in the Last Year:   Transportation Needs:   . Film/video editor (Medical):   Marland Kitchen Lack of Transportation (Non-Medical):   Physical Activity:   . Days of Exercise per Week:   . Minutes of Exercise per Session:   Stress:   . Feeling of Stress :   Social Connections:   . Frequency of Communication with Friends and Family:   . Frequency of Social Gatherings with Friends and Family:   . Attends Religious Services:   . Active Member of Clubs or Organizations:   . Attends Archivist Meetings:   Marland Kitchen Marital Status:   Intimate Partner Violence:   . Fear of Current or Ex-Partner:   . Emotionally Abused:   Marland Kitchen Physically Abused:   . Sexually Abused:  Past Surgical History:  Procedure Laterality Date  . CHOLECYSTECTOMY N/A 09/03/2016   Procedure: LAPAROSCOPIC CHOLECYSTECTOMY;  Surgeon: Emelia Loron, MD;  Location: [facility] OR;  Service: General;  Laterality: N/A;  . INSERTION OF MESH N/A 11/22/2018   Procedure: Insertion Of Mesh;  Surgeon: Claud Kelp, MD;  Location: [facility] OR;  Service: General;  Laterality: N/A;  . TONSILLECTOMY    . VENTRAL HERNIA REPAIR N/A 11/22/2018   Procedure: LAPAROSCOPIC REPAIR VENTRAL HERNIA WITH MESH;  Surgeon: Claud Kelp, MD;  Location: [facility] OR;  Service: General;  Laterality: N/A;    Family History  Problem Relation Age of Onset  . Crohn's disease Other   . Gout Father     Allergies  Allergen Reactions  . Azithromycin Rash    Current Outpatient Medications on File Prior to Visit  Medication Sig Dispense Refill  . allopurinol (ZYLOPRIM) 300 MG tablet Take 1 tablet (300 mg total) by mouth  daily. 90 tablet 1  . atorvastatin (LIPITOR) 20 MG tablet Take 1 tablet (20 mg total) by mouth daily. 90 tablet 3  . colchicine 0.6 MG tablet Take two tabs on thew first day and then 1 tab daily until gout flare has resolved. 30 tablet 1  . HYDROcodone-acetaminophen (NORCO) 5-325 MG tablet Take 1-2 tablets by mouth every 6 (six) hours as needed for moderate pain or severe pain. 30 tablet 0  . Omega-3 Fatty Acids (FISH OIL PO) Take 1 capsule by mouth 4 (four) times a week.     No current facility-administered medications on file prior to visit.    There were no vitals taken for this visit.      Objective:   Physical Exam Vitals and nursing note reviewed.  Constitutional:      Appearance: Normal appearance.  Cardiovascular:     Rate and Rhythm: Normal rate and regular rhythm.     Pulses: Normal pulses.     Heart sounds: Normal heart sounds.  Pulmonary:     Effort: Pulmonary effort is normal.     Breath sounds: Normal breath sounds.  Musculoskeletal:        General: Normal range of motion.  Skin:    General: Skin is warm and dry.  Neurological:     General: No focal deficit present.     Mental Status: He is alert and oriented to person, place, and time.  Psychiatric:        Mood and Affect: Mood normal.        Behavior: Behavior normal.        Thought Content: Thought content normal.        Judgment: Judgment normal.       Assessment & Plan:

## 2020-11-20 DIAGNOSIS — M7661 Achilles tendinitis, right leg: Secondary | ICD-10-CM | POA: Diagnosis not present

## 2020-11-20 DIAGNOSIS — M79671 Pain in right foot: Secondary | ICD-10-CM | POA: Diagnosis not present

## 2020-11-20 DIAGNOSIS — M722 Plantar fascial fibromatosis: Secondary | ICD-10-CM | POA: Diagnosis not present

## 2021-05-20 ENCOUNTER — Other Ambulatory Visit: Payer: Self-pay

## 2021-05-20 ENCOUNTER — Ambulatory Visit: Payer: 59 | Admitting: Podiatry

## 2021-05-20 ENCOUNTER — Ambulatory Visit (INDEPENDENT_AMBULATORY_CARE_PROVIDER_SITE_OTHER): Payer: 59

## 2021-05-20 DIAGNOSIS — M722 Plantar fascial fibromatosis: Secondary | ICD-10-CM

## 2021-05-20 MED ORDER — TRIAMCINOLONE ACETONIDE 10 MG/ML IJ SUSP
10.0000 mg | Freq: Once | INTRAMUSCULAR | Status: AC
  Administered 2021-05-20: 10:00:00 10 mg

## 2021-05-20 MED ORDER — PREDNISONE 10 MG PO TABS: ORAL_TABLET | ORAL | 0 refills | Status: DC

## 2021-05-20 NOTE — Patient Instructions (Signed)

## 2021-05-20 NOTE — Progress Notes (Signed)
Subjective:   Patient ID: Jonathan Vaughn, male   DOB: 45 y.o.   MRN: [id number]   HPI Patient states he has had a lot of pain in his right heel for around a year and it is gradually gotten worse and he first had a boot that he were only a couple weeks and then lost and he then had several injections over the summer which has not been effective.  States it is very sore and he is looking for some kind of aggressive way to treat this as he is not as busy with work but will start again relatively soon.  Pains are worse when he gets up after periods of sitting and patient does not smoke likes to be active   Review of Systems  All other systems reviewed and are negative.      Objective:  Physical Exam Vitals and nursing note reviewed.  Constitutional:      Appearance: He is well-developed.  Pulmonary:     Effort: Pulmonary effort is normal.  Musculoskeletal:        General: Normal range of motion.  Skin:    General: Skin is warm.  Neurological:     Mental Status: He is alert.    Neurovascular status intact muscle strength adequate range of motion within normal limits with exquisite discomfort in the plantar aspect of the right heel at the insertional point of the tendon into the calcaneus with inflammation fluid buildup.  Patient is noted to have good digital perfusion well oriented x3 with moderate equinus condition.  Patient has good digital perfusion well oriented x3     Assessment:  Acute Planter fasciitis that so far has not responded to different conservative treatments attempted     Plan:  H&P reviewed condition and x-ray and at this point I have recommended a progressive conservative approach and I did do sterile prep and I injected the right fascia 3 mg Kenalog 5 mg Xylocaine at insertion and I applied air fracture walker to completely immobilize and placed on 12-day Sterapred DS Dosepak.  Patient will be seen back to recheck again in the next 3 weeks and ultimately could  require more aggressive treatment plan depending on response  X-rays indicate spur no indication stress fracture arthritis

## 2021-06-10 ENCOUNTER — Ambulatory Visit: Payer: 59 | Admitting: Podiatry

## 2021-07-23 ENCOUNTER — Encounter: Payer: Self-pay | Admitting: Podiatrist

## 2021-07-23 ENCOUNTER — Ambulatory Visit: Payer: 59 | Admitting: Podiatrist

## 2021-07-23 ENCOUNTER — Telehealth: Payer: Self-pay | Admitting: *Deleted

## 2021-07-23 ENCOUNTER — Other Ambulatory Visit: Payer: Self-pay

## 2021-07-23 DIAGNOSIS — M7752 Other enthesopathy of left foot: Secondary | ICD-10-CM | POA: Diagnosis not present

## 2021-07-23 DIAGNOSIS — M7662 Achilles tendinitis, left leg: Secondary | ICD-10-CM

## 2021-07-23 MED ORDER — INDOMETHACIN 50 MG PO CAPS: 50.0000 mg | ORAL_CAPSULE | Freq: Three times a day (TID) | ORAL | 2 refills | Status: DC

## 2021-07-23 MED ORDER — TRIAMCINOLONE ACETONIDE 10 MG/ML IJ SUSP
10.0000 mg | Freq: Once | INTRAMUSCULAR | Status: AC
  Administered 2021-07-23: 10 mg

## 2021-07-23 NOTE — Patient Instructions (Signed)
Achilles Tendinitis Achilles tendinitis is inflammation of the tough, cord-like band that attaches the lower leg muscles to the heel bone (Achilles tendon). This is usually caused by overusing the tendon and the ankle joint. Achilles tendinitis usually gets better over time with treatment and caring for yourself at home. It can take weeks or months to heal completely. What are the causes? This condition may be caused by: A sudden increase in exercise or activity, such as running. Doing the same exercises or activities, such as jumping, over and over. Not warming up calf muscles before exercising. Exercising in shoes that are worn out or not made for exercise. Having arthritis or a bone growth (spur) on the back of the heel bone. This can rub against the tendon and hurt it. Age-related wear and tear. Tendons become less flexible with age and are more likely to be injured. What are the signs or symptoms? Common symptoms of this condition include: Pain in the Achilles tendon or in the back of the leg, just above the heel. The pain usually gets worse with exercise. Stiffness or soreness in the back of the leg, especially in the morning. Swelling of the skin over the Achilles tendon. Thickening of the tendon. Trouble standing on tiptoe. How is this diagnosed? This condition is diagnosed based on your symptoms and a physical exam. You may have tests, including: X-rays. MRI. How is this treated? The goal of treatment is to relieve symptoms and help your injury heal. Treatment may include: Decreasing or stopping activities that caused the tendinitis. This may mean switching to low-impact exercises like biking or swimming. Icing the injured area. Doing physical therapy, including strengthening and stretching exercises. Taking NSAIDs, such as ibuprofen, to help relieve pain and swelling. Using supportive shoes, wraps, heel lifts, or a walking boot (air cast). Having surgery. This may be done if  your symptoms do not improve after other treatments. Using high-energy shock wave impulses to stimulate the healing process (extracorporeal shock wave therapy). This is rare. Having an injection of medicines that help relieve inflammation (corticosteroids). This is rare. Follow these instructions at home: If you have an air cast: Wear the air cast as told by your health care provider. Remove it only as told by your health care provider. Loosen it if your toes tingle, become numb, or turn cold and blue. Keep it clean. If the air cast is not waterproof: Do not let it get wet. Cover it with a watertight covering when you take a bath or shower. Managing pain, stiffness, and swelling  If directed, put ice on the injured area. To do this: If you have a removable air cast, remove it as told by your health care provider. Put ice in a plastic bag. Place a towel between your skin and the bag. Leave the ice on for 20 minutes, 2-3 times a day. Move your toes often to reduce stiffness and swelling. Raise (elevate) your foot above the level of your heart while you are sitting or lying down. Activity Gradually return to your normal activities as told by your health care provider. Ask your health care provider what activities are safe for you. Do not do activities that cause pain. Consider doing low-impact exercises, like cycling or swimming. Ask your health care provider when it is safe to drive if you have an air cast on your foot. If physical therapy was prescribed, do exercises as told by your health care provider or physical therapist. General instructions If directed, wrap your foot  with an elastic bandage or other wrap. This can help to keep your tendon from moving too much while it heals. Your health care provider will show you how to wrap your foot correctly. Wear supportive shoes or heel lifts only as told by your health care provider. Take over-the-counter and prescription medicines only as  told by your health care provider. Keep all follow-up visits as told by your health care provider. This is important. Contact a health care provider if you: Have symptoms that get worse. Have pain that does not get better with medicine. Develop new, unexplained symptoms. Develop warmth and swelling in your foot. Have a fever. Get help right away if you: Have a sudden popping sound or sensation in your Achilles tendon followed by severe pain. Cannot move your toes or foot. Cannot put any weight on your foot. Your foot or toes become numb and look white or blue even after loosening your bandage or air cast. Summary Achilles tendinitis is inflammation of the tough, cord-like band that attaches the lower leg muscles to the heel bone (Achilles tendon). This condition is usually caused by overusing the tendon and the ankle joint. It can also be caused by arthritis or normal aging. The most common symptoms of this condition include pain, swelling, or stiffness in the Achilles tendon or in the back of the leg. This condition is usually treated by decreasing or stopping activities that caused the tendinitis, icing the injured area, taking NSAIDs, and doing physical therapy. This information is not intended to replace advice given to you by your health care provider. Make sure you discuss any questions you have with your health care provider.

## 2021-07-23 NOTE — Progress Notes (Signed)
Chief Complaint  Patient presents with   Foot Pain    Follow up achilles tendonitis left - severe aching, redness started yesterday, today can barely put weight on it, feels stiff, tried wife's pain meds last night, shot last visit helped but keeps flaring, flared up Sunday (2 days ago)      HPI: Patient is 46 y.o. male who presents today for pain, swelling, and flareup of plantar and posterior heel pain of the left foot.  He relates severe pain that started this past Sunday and he can hardly put any weight on the foot.  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  He has been stretching the heel with minimal relief in symptoms.  He was seen recently for plantar fasciitis on the right heel of which he states is improved.  He also relates that gout runs heavily in his family.   Allergies  Allergen Reactions   Azithromycin Rash    Review of systems is negative except as noted in the HPI.  Denies nausea/ vomiting/ fevers/ chills or night sweats.   Denies difficulty breathing, denies calf pain or tenderness  Physical Exam  Patient is awake, alert, and oriented x 3.  In no acute distress.    Vascular status is intact with palpable pedal pulses DP and PT bilateral and capillary refill time less than 3 seconds bilateral.  No edema or erythema noted.   Neurological exam reveals epicritic and protective sensation grossly intact bilateral.   Dermatological exam reveals skin is supple and dry to bilateral feet.  No open lesions present.  Redness and swelling along the posterior aspect of the left heel is noted.   No sign of break in the skin or area of skin infection noted.  Musculoskeletal exam: Musculature intact with dorsiflexion, plantarflexion, inversion, eversion. Ankle and First MPJ joint range of motion normal. Pain with direct pressure along the Achilles tendon is a noted.  Some pain along the plantar fascia left heel is elicited.    Assessment:    ICD-10-CM   1. Tendonitis, Achilles, left  M76.62     2. Bursitis of left foot  M77.52 triamcinolone acetonide (KENALOG) 10 MG/ML injection 10 mg       Plan: Discussed exam findings and symptoms with the patient.  Discussed that this could be a gout flare versus tendinitis.  At today's visit I recommended an injection into the left foot in order to try and relieve the symptoms he is experiencing.  The patient agreed I prepped the skin with alcohol infiltrated 10 mg of Kenalog and Marcaine plain into the plantar aspect medial aspect of the left heel.  The patient tolerated this well.  I also recommended anti-inflammatory medication and I called in indomethacin and gave instructions for its use.  Recommended a follow-up with Dr. Charlsie Merles in 3 weeks or sooner if symptoms do not resolve status post the injection.  If any other questions or concerns arise in the meantime he is instructed to call.

## 2021-07-23 NOTE — Telephone Encounter (Signed)
Patient is calling and said that medication was sent to wrong pharmacy, was supposed to be sent to [facility], instead was sent to [facility], [facility].  Called and resent to correct pharmacy,patient notified.

## 2021-09-23 ENCOUNTER — Ambulatory Visit: Payer: 59 | Admitting: Podiatry

## 2022-06-08 ENCOUNTER — Ambulatory Visit (HOSPITAL_COMMUNITY): Payer: Self-pay

## 2022-07-03 ENCOUNTER — Encounter (HOSPITAL_COMMUNITY): Payer: Self-pay | Admitting: Emergency Medicine

## 2022-07-03 ENCOUNTER — Ambulatory Visit (HOSPITAL_COMMUNITY)
Admission: EM | Admit: 2022-07-03 | Discharge: 2022-07-03 | Disposition: A | Discharge status: Home / Self Care | Payer: 59 | Attending: Family Medicine | Admitting: Family Medicine

## 2022-07-03 DIAGNOSIS — B349 Viral infection, unspecified: Secondary | ICD-10-CM

## 2022-07-03 DIAGNOSIS — U071 COVID-19: Secondary | ICD-10-CM | POA: Insufficient documentation

## 2022-07-03 LAB — SARS CORONAVIRUS 2 (TAT 6-24 HRS): SARS Coronavirus 2: POSITIVE — AB

## 2022-07-03 MED ORDER — ONDANSETRON 4 MG PO TBDP: 4.0000 mg | ORAL_TABLET | Freq: Three times a day (TID) | ORAL | 0 refills | Status: DC | PRN

## 2022-07-03 NOTE — ED Provider Notes (Signed)
[facility]    CSN: [id number] Arrival date & time: 07/03/22  1007      History   Chief Complaint No chief complaint on file.   HPI Jonathan Vaughn is a 47 y.o. male.   HPI Here for subjective fever and malaise and sore throat.  The symptoms began about January 3 with the malaise and fatigue.  He was on a hunting trip the January 3 and 4.  Then when he returned on the fifth he started having some sore throat and had some subjective fever by January 5.  He spoke with other people that he had been hunting with and one had tested positive for COVID after he came back.  Through at least January night he continued to have some subjective fever.  He still has been having some sore throat and malaise since then.  He has not had any cough.  He has had some diarrhea and some nausea ever since this started, but last night he went to get some ice cream and he felt much worse and broke out in a sweat.  No shortness of breath.  Past Medical History:  Diagnosis Date   False positive QuantiFERON-TB Gold test    GERD 03/21/2010   Qualifier: Diagnosis of  By: Nelson-Smith CMA (AAMA), Dottie     Gout    Headache(784.0) 01/10/2009   Qualifier: Diagnosis of  By: Valma Cava LPN, Nancy     Incarcerated incisional hernia 11/22/2018   LATERAL EPICONDYLITIS OF ELBOW 08/03/2009   Qualifier: Diagnosis of  By: Elease Hashimoto MD, Loleta Chance PAIN 01/23/2010   Qualifier: Diagnosis of  By: Valma Cava LPN, Izora Gala      Patient Active Problem List   Diagnosis Date Noted   Pain in right foot 11/20/2020   Mixed hyperlipidemia 10/25/2019   Incarcerated incisional hernia 11/22/2018   Tobacco use disorder 11/26/2016   Gout 11/26/2016   GERD 03/21/2010   GASTRITIS 03/21/2010   SHOULDER PAIN 01/23/2010   LATERAL EPICONDYLITIS OF ELBOW 08/03/2009   HEADACHE 01/10/2009    Past Surgical History:  Procedure Laterality Date   CHOLECYSTECTOMY N/A 09/03/2016   Procedure: LAPAROSCOPIC CHOLECYSTECTOMY;  Surgeon:  Rolm Bookbinder, MD;  Location: [place];  Service: General;  Laterality: N/A;   INSERTION OF MESH N/A 11/22/2018   Procedure: Insertion Of Mesh;  Surgeon: Fanny Skates, MD;  Location: [place];  Service: General;  Laterality: N/A;   TONSILLECTOMY     VENTRAL HERNIA REPAIR N/A 11/22/2018   Procedure: LAPAROSCOPIC REPAIR VENTRAL HERNIA WITH MESH;  Surgeon: Fanny Skates, MD;  Location: [place];  Service: General;  Laterality: N/A;       Home Medications    Prior to Admission medications   Medication Sig Start Date End Date Taking? Authorizing Provider  ondansetron (ZOFRAN-ODT) 4 MG disintegrating tablet Take 1 tablet (4 mg total) by mouth every 8 (eight) hours as needed for nausea or vomiting. 07/03/22  Yes Davier Tramell, Gwenlyn Perking, MD    Family History Family History  Problem Relation Age of Onset   Crohn's disease Other    Gout Father     Social History Social History   Tobacco Use   Smoking status: Former    Packs/day: 3.00    Years: 15.00    Total pack years: 45.00    Types: Cigarettes    Quit date: 06/23/2016    Years since quitting: 6.0   Smokeless tobacco: Never  Substance Use Topics   Alcohol use: Not Currently  Comment: pt states he no longer drinks- quit in 2018   Drug use: No     Allergies   Azithromycin   Review of Systems Review of Systems   Physical Exam Triage Vital Signs ED Triage Vitals  Enc Vitals Group     BP 07/03/22 1059 (!) 125/104     Pulse Rate 07/03/22 1059 89     Resp 07/03/22 1059 18     Temp 07/03/22 1103 (!) 97.5 F (36.4 C)     Temp Source 07/03/22 1059 Oral     SpO2 07/03/22 1059 99 %     Weight --      Height --      Head Circumference --      Peak Flow --      Pain Score 07/03/22 1057 0     Pain Loc --      Pain Edu? --      Excl. in [place]? --    No data found.  Updated Vital Signs BP (!) 135/103 (BP Location: Left Arm)   Pulse 89   Temp (!) 97.5 F (36.4 C) (Oral)   Resp 18   SpO2 99%   Visual Acuity Right Eye  Distance:   Left Eye Distance:   Bilateral Distance:    Right Eye Near:   Left Eye Near:    Bilateral Near:     Physical Exam Vitals reviewed.  Constitutional:      General: He is not in acute distress.    Appearance: He is not ill-appearing, toxic-appearing or diaphoretic.  HENT:     Right Ear: Tympanic membrane and ear canal normal.     Left Ear: Tympanic membrane and ear canal normal.     Nose: Nose normal.     Mouth/Throat:     Mouth: Mucous membranes are moist.     Pharynx: No oropharyngeal exudate or posterior oropharyngeal erythema.     Comments: There is no erythema in the oropharynx at all. Eyes:     Extraocular Movements: Extraocular movements intact.     Conjunctiva/sclera: Conjunctivae normal.     Pupils: Pupils are equal, round, and reactive to light.  Cardiovascular:     Rate and Rhythm: Normal rate and regular rhythm.     Heart sounds: No murmur heard. Pulmonary:     Effort: No respiratory distress.     Breath sounds: Normal breath sounds. No stridor. No wheezing, rhonchi or rales.  Musculoskeletal:     Cervical back: Neck supple.  Lymphadenopathy:     Cervical: No cervical adenopathy.  Skin:    Capillary Refill: Capillary refill takes less than 2 seconds.     Coloration: Skin is not jaundiced or pale.  Neurological:     General: No focal deficit present.     Mental Status: He is alert and oriented to person, place, and time.  Psychiatric:        Behavior: Behavior normal.      UC Treatments / Results  Labs (all labs ordered are listed, but only abnormal results are displayed) Labs Reviewed  SARS CORONAVIRUS 2 (TAT 6-24 HRS)    EKG   Radiology No results found.  Procedures Procedures (including critical care time)  Medications Ordered in UC Medications - No data to display  Initial Impression / Assessment and Plan / UC Course  I have reviewed the triage vital signs and the nursing notes.  Pertinent labs & imaging results that were  available during my care of the patient  were reviewed by me and considered in my medical decision making (see chart for details).        I did discuss with him how the timing of testing is not going to reveal much other than whether or not he actually has had COVID.  He may have had COVID or other viral illness initially and then may have a new one starting with a gastroenteritis overnight.  Zofran is sent for the nausea, and COVID swab was done so he will know if that is been part of what he is dealing with.  I do not feel he needs a chest x-ray as he is not having any cough or shortness of breath Final Clinical Impressions(s) / UC Diagnoses   Final diagnoses:  Viral illness     Discharge Instructions      Ondansetron dissolved in the mouth every 8 hours as needed for nausea or vomiting. Clear liquids and bland things to eat.   You have been swabbed for COVID, and the test will result in the next 24 hours. Our staff will call you if positive. If the COVID test is positive, you should quarantine for 5 days from the start of your symptoms      ED Prescriptions     Medication Sig Dispense Auth. Provider   ondansetron (ZOFRAN-ODT) 4 MG disintegrating tablet Take 1 tablet (4 mg total) by mouth every 8 (eight) hours as needed for nausea or vomiting. 10 tablet Marlinda Mike Janace Aris, MD      PDMP not reviewed this encounter.   Zenia Resides, MD 07/03/22 [phone]

## 2022-07-03 NOTE — ED Triage Notes (Signed)
Around 1/3-1/4 went rabbit hunting. Started not feeling well. Reports started having sore throat, fatigue and felt worse on 1/5. Reports that one person within hunting group had Covid.  Reports last night went to get some ice cream and felt nauseated and sweating.  Took tylenol cold and flu.

## 2022-07-03 NOTE — Discharge Instructions (Signed)
Ondansetron dissolved in the mouth every 8 hours as needed for nausea or vomiting. Clear liquids and bland things to eat.   You have been swabbed for COVID, and the test will result in the next 24 hours. Our staff will call you if positive. If the COVID test is positive, you should quarantine for 5 days from the start of your symptoms

## 2022-07-11 ENCOUNTER — Other Ambulatory Visit: Payer: Self-pay | Admitting: Adult Health

## 2022-07-11 ENCOUNTER — Ambulatory Visit (INDEPENDENT_AMBULATORY_CARE_PROVIDER_SITE_OTHER): Payer: 59 | Admitting: Adult Health

## 2022-07-11 ENCOUNTER — Encounter: Payer: Self-pay | Admitting: Adult Health

## 2022-07-11 VITALS — BP 140/82 | HR 94 | Temp 98.0°F | Ht 74.5 in | Wt 270.0 lb

## 2022-07-11 DIAGNOSIS — I10 Essential (primary) hypertension: Secondary | ICD-10-CM | POA: Diagnosis not present

## 2022-07-11 DIAGNOSIS — E1169 Type 2 diabetes mellitus with other specified complication: Secondary | ICD-10-CM

## 2022-07-11 LAB — POCT URINALYSIS DIPSTICK
Bilirubin, UA: NEGATIVE
Glucose, UA: POSITIVE — AB
Ketones, UA: NEGATIVE
Leukocytes, UA: NEGATIVE
Nitrite, UA: NEGATIVE
Protein, UA: POSITIVE — AB
Spec Grav, UA: 1.025 (ref 1.010–1.025)
Urobilinogen, UA: 0.2 E.U./dL
pH, UA: 5.5 (ref 5.0–8.0)

## 2022-07-11 LAB — POCT GLYCOSYLATED HEMOGLOBIN (HGB A1C): Hemoglobin A1C: 9.9 % — AB (ref 4.0–5.6)

## 2022-07-11 LAB — COMPREHENSIVE METABOLIC PANEL
ALT: 154 U/L — ABNORMAL HIGH (ref 0–53)
AST: 76 U/L — ABNORMAL HIGH (ref 0–37)
Albumin: 4.5 g/dL (ref 3.5–5.2)
Alkaline Phosphatase: 109 U/L (ref 39–117)
BUN: 16 mg/dL (ref 6–23)
CO2: 26 mEq/L (ref 19–32)
Calcium: 9.3 mg/dL (ref 8.4–10.5)
Chloride: 97 mEq/L (ref 96–112)
Creatinine, Ser: 0.94 mg/dL (ref 0.40–1.50)
GFR: 96.8 mL/min (ref >60.00)
Glucose, Bld: 321 mg/dL — ABNORMAL HIGH (ref 70–99)
Potassium: 4 mEq/L (ref 3.5–5.1)
Sodium: 135 mEq/L (ref 135–145)
Total Bilirubin: 0.6 mg/dL (ref 0.2–1.2)
Total Protein: 7.6 g/dL (ref 6.0–8.3)

## 2022-07-11 LAB — MICROALBUMIN / CREATININE URINE RATIO
Creatinine,U: 171.3 mg/dL
Microalb Creat Ratio: 14 mg/g (ref 0.0–30.0)
Microalb, Ur: 24 mg/dL — ABNORMAL HIGH (ref 0.0–1.9)

## 2022-07-11 LAB — LDL CHOLESTEROL, DIRECT: Direct LDL: 103 mg/dL

## 2022-07-11 LAB — LIPID PANEL
Cholesterol: 263 mg/dL — ABNORMAL HIGH (ref 0–200)
HDL: 32.6 mg/dL — ABNORMAL LOW (ref >39.00)
Total CHOL/HDL Ratio: 8
Triglycerides: 641 mg/dL — ABNORMAL HIGH (ref 0.0–149.0)

## 2022-07-11 MED ORDER — METFORMIN HCL 500 MG PO TABS: 500.0000 mg | ORAL_TABLET | Freq: Two times a day (BID) | ORAL | 0 refills | Status: DC

## 2022-07-11 MED ORDER — ROSUVASTATIN CALCIUM 20 MG PO TABS: 20.0000 mg | ORAL_TABLET | Freq: Every day | ORAL | 3 refills | Status: DC

## 2022-07-11 MED ORDER — FREESTYLE LIBRE 3 SENSOR MISC: 1.0000 | 6 refills | Status: DC

## 2022-07-11 MED ORDER — LISINOPRIL 10 MG PO TABS: 10.0000 mg | ORAL_TABLET | Freq: Every day | ORAL | 3 refills | Status: DC

## 2022-07-11 NOTE — Progress Notes (Signed)
Subjective:    Patient ID: Jonathan Vaughn, male    DOB: 16-Sep-1975, 47 y.o.   MRN: [id number]  Urinary Frequency    47 year old male who  has a past medical history of False positive QuantiFERON-TB Gold test, GERD (03/21/2010), Gout, Headache(784.0) (01/10/2009), Incarcerated incisional hernia (11/22/2018), LATERAL EPICONDYLITIS OF ELBOW (08/03/2009), and SHOULDER PAIN (01/23/2010).  He has not been seen in the office since 08/2019.   He presents to the office today for an acute issue of excessive thirst, frequent urination up to 7 times a night and foamy urine x 2 weeks   Lab Results  Component Value Date   HGBA1C 5.7 08/17/2019   Wt Readings from Last 3 Encounters:  07/11/22 270 lb (122.5 kg)  08/10/19 250 lb (113.4 kg)  11/22/18 254 lb 12.8 oz (115.6 kg)    Review of Systems See HPI   Past Medical History:  Diagnosis Date   False positive QuantiFERON-TB Gold test    GERD 03/21/2010   Qualifier: Diagnosis of  By: Nelson-Smith CMA (AAMA), Dottie     Gout    Headache(784.0) 01/10/2009   Qualifier: Diagnosis of  By: Valma Cava LPN, Nancy     Incarcerated incisional hernia 11/22/2018   LATERAL EPICONDYLITIS OF ELBOW 08/03/2009   Qualifier: Diagnosis of  By: Elease Hashimoto MD, [place] 01/23/2010   Qualifier: Diagnosis of  By: Valma Cava LPN, Izora Gala      Social History   Socioeconomic History   Marital status: Married    Spouse name: Not on file   Number of children: Not on file   Years of education: Not on file   Highest education level: Not on file  Occupational History   Not on file  Tobacco Use   Smoking status: Former    Packs/day: 3.00    Years: 15.00    Total pack years: 45.00    Types: Cigarettes    Quit date: 06/23/2016    Years since quitting: 6.0   Smokeless tobacco: Never  Substance and Sexual Activity   Alcohol use: Not Currently    Comment: pt states he no longer drinks- quit in 2018   Drug use: No   Sexual activity: Not on file  Other Topics Concern    Not on file  Social History Narrative   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    Married    Has children       Social Determinants of Health   Financial Resource Strain: Not on file  Food Insecurity: Not on file  Transportation Needs: Not on file  Physical Activity: Not on file  Stress: Not on file  Social Connections: Not on file  Intimate Partner Violence: Not on file    Past Surgical History:  Procedure Laterality Date   CHOLECYSTECTOMY N/A 09/03/2016   Procedure: LAPAROSCOPIC CHOLECYSTECTOMY;  Surgeon: Rolm Bookbinder, MD;  Location: [place];  Service: General;  Laterality: N/A;   INSERTION OF MESH N/A 11/22/2018   Procedure: Insertion Of Mesh;  Surgeon: Fanny Skates, MD;  Location: [place];  Service: General;  Laterality: N/A;   TONSILLECTOMY     VENTRAL HERNIA REPAIR N/A 11/22/2018   Procedure: LAPAROSCOPIC REPAIR VENTRAL HERNIA WITH MESH;  Surgeon: Fanny Skates, MD;  Location: [place];  Service: General;  Laterality: N/A;    Family History  Problem Relation Age of Onset   Crohn's disease Other    Gout Father     Allergies  Allergen Reactions  Azithromycin Rash    Current Outpatient Medications on File Prior to Visit  Medication Sig Dispense Refill   ondansetron (ZOFRAN-ODT) 4 MG disintegrating tablet Take 1 tablet (4 mg total) by mouth every 8 (eight) hours as needed for nausea or vomiting. 10 tablet 0   No current facility-administered medications on file prior to visit.    BP (!) 140/82   Pulse 94   Temp 98 F (36.7 C) (Oral)   Ht 6' 2.5" (1.892 m)   Wt 270 lb (122.5 kg)   SpO2 95%   BMI 34.20 kg/m       Objective:   Physical Exam Vitals and nursing note reviewed.  Constitutional:      Appearance: Normal appearance.  Cardiovascular:     Rate and Rhythm: Normal rate and regular rhythm.     Pulses: Normal pulses.     Heart sounds: Normal heart sounds.  Pulmonary:     Effort: Pulmonary effort is normal.     Breath sounds: Normal breath  sounds.  Musculoskeletal:        General: Normal range of motion.  Skin:    General: Skin is warm and dry.  Neurological:     General: No focal deficit present.     Mental Status: He is alert and oriented to person, place, and time.  Psychiatric:        Mood and Affect: Mood normal.        Behavior: Behavior normal.       Assessment & Plan:  1. Type 2 diabetes mellitus with other specified complication, without long-term current use of insulin (HCC)  - POC HgB A1c- 9.9 newly diagnosed diabetic - Follow up in one month  - POC Urinalysis Dipstick - metFORMIN (GLUCOPHAGE) 500 MG tablet; Take 1 tablet (500 mg total) by mouth 2 (two) times daily with a meal.  Dispense: 180 tablet; Refill: 0 - Amb Referral to Nutrition and Diabetic Education - Lipid panel; Future - Comprehensive metabolic panel; Future - Microalbumin/Creatinine Ratio, Urine; Future - Continuous Blood Gluc Sensor (FREESTYLE LIBRE 3 SENSOR) MISC; 1 Device by Does not apply route every 14 (fourteen) days. Place 1 sensor on the skin every 14 days. Use to check glucose continuously  Dispense: 2 each; Refill: 6  2. Primary hypertension  - lisinopril (ZESTRIL) 10 MG tablet; Take 1 tablet (10 mg total) by mouth daily.  Dispense: 90 tablet; Refill: 3  Dorothyann Peng, NP

## 2022-07-11 NOTE — Patient Instructions (Signed)
You are now considered diabetic I am going to put you on Metformin 500 mg twice a day.   I am also going to put you on a blood pressure medication   Please follow up in 1 month or sooner if needed  Someone from diabetic nutrition will call you

## 2022-07-14 ENCOUNTER — Emergency Department (HOSPITAL_COMMUNITY): Payer: 59

## 2022-07-14 ENCOUNTER — Emergency Department (HOSPITAL_COMMUNITY)
Admission: EM | Admit: 2022-07-14 | Discharge: 2022-07-14 | Disposition: A | Discharge status: Home / Self Care | Payer: 59 | Attending: Emergency Medicine | Admitting: Emergency Medicine

## 2022-07-14 ENCOUNTER — Telehealth: Payer: Self-pay | Admitting: Adult Health

## 2022-07-14 DIAGNOSIS — R739 Hyperglycemia, unspecified: Secondary | ICD-10-CM

## 2022-07-14 DIAGNOSIS — Z7984 Long term (current) use of oral hypoglycemic drugs: Secondary | ICD-10-CM | POA: Diagnosis not present

## 2022-07-14 DIAGNOSIS — R202 Paresthesia of skin: Secondary | ICD-10-CM

## 2022-07-14 DIAGNOSIS — I10 Essential (primary) hypertension: Secondary | ICD-10-CM | POA: Diagnosis not present

## 2022-07-14 DIAGNOSIS — E1165 Type 2 diabetes mellitus with hyperglycemia: Secondary | ICD-10-CM | POA: Diagnosis not present

## 2022-07-14 LAB — CBC WITH DIFFERENTIAL/PLATELET
Abs Immature Granulocytes: 0.05 10*3/uL (ref 0.00–0.07)
Basophils Absolute: 0.1 10*3/uL (ref 0.0–0.1)
Basophils Relative: 1 %
Eosinophils Absolute: 0.1 10*3/uL (ref 0.0–0.5)
Eosinophils Relative: 2 %
HCT: 40.4 % (ref 39.0–52.0)
Hemoglobin: 13.8 g/dL (ref 13.0–17.0)
Immature Granulocytes: 1 %
Lymphocytes Relative: 25 %
Lymphs Abs: 1.7 10*3/uL (ref 0.7–4.0)
MCH: 29.4 pg (ref 26.0–34.0)
MCHC: 34.2 g/dL (ref 30.0–36.0)
MCV: 86.1 fL (ref 80.0–100.0)
Monocytes Absolute: 0.4 10*3/uL (ref 0.1–1.0)
Monocytes Relative: 6 %
Neutro Abs: 4.4 10*3/uL (ref 1.7–7.7)
Neutrophils Relative %: 65 %
Platelets: 214 10*3/uL (ref 150–400)
RBC: 4.69 MIL/uL (ref 4.22–5.81)
RDW: 13.1 % (ref 11.5–15.5)
WBC: 6.8 10*3/uL (ref 4.0–10.5)
nRBC: 0 % (ref 0.0–0.2)

## 2022-07-14 LAB — BASIC METABOLIC PANEL
Anion gap: 10 (ref 5–15)
BUN: 15 mg/dL (ref 6–20)
CO2: 23 mmol/L (ref 22–32)
Calcium: 8.7 mg/dL — ABNORMAL LOW (ref 8.9–10.3)
Chloride: 97 mmol/L — ABNORMAL LOW (ref 98–111)
Creatinine, Ser: 0.98 mg/dL (ref 0.61–1.24)
GFR, Estimated: >60 mL/min (ref >60)
Glucose, Bld: 375 mg/dL — ABNORMAL HIGH (ref 70–99)
Potassium: 3.6 mmol/L (ref 3.5–5.1)
Sodium: 130 mmol/L — ABNORMAL LOW (ref 135–145)

## 2022-07-14 LAB — MAGNESIUM: Magnesium: 1.8 mg/dL (ref 1.7–2.4)

## 2022-07-14 LAB — CBG MONITORING, ED: Glucose-Capillary: 366 mg/dL — ABNORMAL HIGH (ref 70–99)

## 2022-07-14 MED ORDER — DIAZEPAM 5 MG/ML IJ SOLN
5.0000 mg | Freq: Once | INTRAMUSCULAR | Status: AC
  Administered 2022-07-14: 5 mg via INTRAVENOUS
  Filled 2022-07-14: qty 2

## 2022-07-14 MED ORDER — DIPHENHYDRAMINE HCL 50 MG/ML IJ SOLN
25.0000 mg | Freq: Once | INTRAMUSCULAR | Status: AC
  Administered 2022-07-14: 25 mg via INTRAVENOUS
  Filled 2022-07-14: qty 1

## 2022-07-14 MED ORDER — KETOROLAC TROMETHAMINE 15 MG/ML IJ SOLN
15.0000 mg | Freq: Once | INTRAMUSCULAR | Status: AC
  Administered 2022-07-14: 15 mg via INTRAVENOUS
  Filled 2022-07-14: qty 1

## 2022-07-14 MED ORDER — LACTATED RINGERS IV BOLUS
1000.0000 mL | Freq: Once | INTRAVENOUS | Status: AC
  Administered 2022-07-14: 1000 mL via INTRAVENOUS

## 2022-07-14 MED ORDER — METOCLOPRAMIDE HCL 5 MG/ML IJ SOLN
10.0000 mg | Freq: Once | INTRAMUSCULAR | Status: AC
  Administered 2022-07-14: 10 mg via INTRAVENOUS
  Filled 2022-07-14: qty 2

## 2022-07-14 NOTE — ED Provider Notes (Signed)
[facility] AT [facility] Provider Note   CSN: [id number] Arrival date & time: 07/14/22  1500     History  Chief Complaint  Patient presents with   Hyperglycemia    Jonathan Vaughn is a 47 y.o. male.  HPI 47 year old male who was recently diagnosed with type 2 diabetes presents with hyperglycemia and left arm numbness.  The hyperglycemia he noticed today.  This afternoon he was feeling bad and his glucose was reading over 400 on his device.  He also noted left posterior arm tingling/numbness.  He states it feels like when her blood pressure squeezes really tight and the residual symptoms afterwards.  Started around 2:30 PM and resolved after about an hour or 2.  He states that he also developed a headache at the same time as those symptoms.  He has been having progressive headaches and weakness for several days.  A few days ago he was unable to get out of his truck to go hunting due to how bad he felt.  He denies any fevers though he had a little bit of a cough that he thinks is from some of his medicine.  No chest pain.  The headache is frontal and throbbing and rated as a 6 out of 10.  No visual complaints besides chronic blurry vision.  He denies any other recent illness.  He does tell me that the left arm with both numb/tingly but also a little weaker this afternoon.  He did not have any symptoms in the face or leg. Has a continued headache but the arm numbness/weakness is gone. It went from just proximal to his left elbow down to finger tips.  Home Medications Prior to Admission medications   Medication Sig Start Date End Date Taking? Authorizing Provider  Continuous Blood Gluc Sensor (FREESTYLE LIBRE 3 SENSOR) MISC 1 Device by Does not apply route every 14 (fourteen) days. Place 1 sensor on the skin every 14 days. Use to check glucose continuously 07/11/22   Nafziger, Kandee Keen, NP  lisinopril (ZESTRIL) 10 MG tablet Take 1 tablet (10 mg total) by mouth daily.  07/11/22   Nafziger, Kandee Keen, NP  metFORMIN (GLUCOPHAGE) 500 MG tablet Take 1 tablet (500 mg total) by mouth 2 (two) times daily with a meal. 07/11/22   Nafziger, Kandee Keen, NP  ondansetron (ZOFRAN-ODT) 4 MG disintegrating tablet Take 1 tablet (4 mg total) by mouth every 8 (eight) hours as needed for nausea or vomiting. 07/03/22   Zenia Resides, MD  rosuvastatin (CRESTOR) 20 MG tablet Take 1 tablet (20 mg total) by mouth daily. 07/11/22   Shirline Frees, NP      Allergies    Azithromycin    Review of Systems   Review of Systems  Constitutional:  Negative for fever.  Eyes:  Negative for visual disturbance.  Respiratory:  Positive for cough. Negative for shortness of breath.   Neurological:  Positive for weakness, numbness and headaches. Negative for speech difficulty.    Physical Exam Updated Vital Signs BP (!) 133/90   Pulse 85   Temp 97.8 F (36.6 C) (Oral)   Resp (!) 22   SpO2 97%  Physical Exam Vitals and nursing note reviewed.  Constitutional:      General: He is not in acute distress.    Appearance: He is well-developed. He is not ill-appearing or diaphoretic.  HENT:     Head: Normocephalic and atraumatic.  Eyes:     Extraocular Movements: Extraocular movements intact.  Pupils: Pupils are equal, round, and reactive to light.  Cardiovascular:     Rate and Rhythm: Normal rate and regular rhythm.     Pulses:          Radial pulses are 2+ on the left side.     Heart sounds: Normal heart sounds.  Pulmonary:     Effort: Pulmonary effort is normal.     Breath sounds: Normal breath sounds.  Abdominal:     Palpations: Abdomen is soft.     Tenderness: There is no abdominal tenderness.  Musculoskeletal:     Cervical back: No rigidity.  Skin:    General: Skin is warm and dry.  Neurological:     Mental Status: He is alert.     Comments: CN 3-12 grossly intact. 5/5 strength in all 4 extremities. Grossly normal sensation. Normal finger to nose.      ED Results / Procedures /  Treatments   Labs (all labs ordered are listed, but only abnormal results are displayed) Labs Reviewed  BASIC METABOLIC PANEL - Abnormal; Notable for the following components:      Result Value   Sodium 130 (*)    Chloride 97 (*)    Glucose, Bld 375 (*)    Calcium 8.7 (*)    All other components within normal limits  CBG MONITORING, ED - Abnormal; Notable for the following components:   Glucose-Capillary 366 (*)    All other components within normal limits  RESP PANEL BY RT-PCR (RSV, FLU A&B, COVID)  [id number]  CBC WITH DIFFERENTIAL/PLATELET  URINALYSIS, ROUTINE W REFLEX MICROSCOPIC  MAGNESIUM    EKG EKG Interpretation  Date/Time:  Monday July 14 2022 15:27:03 EST Ventricular Rate:  74 PR Interval:  138 QRS Duration: 104 QT Interval:  364 QTC Calculation: 404 R Axis:   14 Text Interpretation: Normal sinus rhythm no acute ST/T changes no significant change since 2018 Confirmed by Sherwood Gambler [phone]) on 07/14/2022 5:15:19 PM  Radiology DG Chest 2 View  Result Date: 07/14/2022 CLINICAL DATA:  Cough, diabetes EXAM: CHEST - 2 VIEW COMPARISON:  Thirteen 2018 FINDINGS: Upper normal heart size. Mediastinal contours and pulmonary vascularity normal. Lungs clear. No pulmonary infiltrate, pleural effusion, or pneumothorax. Osseous structures unremarkable. IMPRESSION: No acute abnormalities. Electronically Signed   By: Lavonia Dana M.D.   On: 07/14/2022 19:06   CT Head Wo Contrast  Result Date: 07/14/2022 CLINICAL DATA:  Headache. EXAM: CT HEAD WITHOUT CONTRAST TECHNIQUE: Contiguous axial images were obtained from the base of the skull through the vertex without intravenous contrast. RADIATION DOSE REDUCTION: This exam was performed according to the departmental dose-optimization program which includes automated exposure control, adjustment of the mA and/or kV according to patient size and/or use of iterative reconstruction technique. COMPARISON:  None Available. FINDINGS: Brain: No  evidence of acute infarction, hemorrhage, hydrocephalus, extra-axial collection or mass lesion/mass effect. Vascular: No hyperdense vessel or unexpected calcification. Skull: Normal. Negative for fracture or focal lesion. Sinuses/Orbits: No acute finding. Other: None. IMPRESSION: No acute intracranial pathology. Electronically Signed   By: Virgina Norfolk M.D.   On: 07/14/2022 17:16    Procedures Procedures    Medications Ordered in ED Medications  lactated ringers bolus 1,000 mL (1,000 mLs Intravenous New Bag/Given 07/14/22 1803)  lactated ringers bolus 1,000 mL (1,000 mLs Intravenous New Bag/Given 07/14/22 1802)  metoCLOPramide (REGLAN) injection 10 mg (10 mg Intravenous Given 07/14/22 1753)  diphenhydrAMINE (BENADRYL) injection 25 mg (25 mg Intravenous Given 07/14/22 1755)  ketorolac (TORADOL) 15 MG/ML  injection 15 mg (15 mg Intravenous Given 07/14/22 1756)  diazepam (VALIUM) injection 5 mg (5 mg Intravenous Given 07/14/22 1816)    ED Course/ Medical Decision Making/ A&P                             Medical Decision Making Amount and/or Complexity of Data Reviewed Labs:     Details: Hyperglycemia without acidosis.  Normal WBC. Radiology: ordered and independent interpretation performed.    Details: CT head without head bleed. ECG/medicine tests: independent interpretation performed.    Details: No acute ST/T changes.  Risk Prescription drug management.   Patient presents with transient tingling/abnormal feeling in his left arm.  There is no chest pain.  He did feel like his arm might have been a little weak.  My suspicion for ACS is pretty low.  It was in a dermatomal like distribution and so I think stroke is unlikely.  I did discuss with neurology and given it sounds more dermatomal and only in the left arm rather than face/leg, stroke or TIA is pretty unlikely and he can follow-up with outpatient neuro but does not need further workup in the ED.  No DKA.  He was given headache  medicine and fluids.  He does not want me to recheck his glucose which I think is reasonable and he does not have any obvious emergencies.  He did start feeling bad after the Reglan despite the Benadryl and was given Valium and feels a lot better from a side effect perspective.  Otherwise, discussed importance of following up with PCP and he appears stable for discharge.        Final Clinical Impression(s) / ED Diagnoses Final diagnoses:  Hyperglycemia  Paresthesia    Rx / DC Orders ED Discharge Orders          Ordered    Ambulatory referral to Neurology       Comments: An appointment is requested in approximately: 2 weeks   07/14/22 2004              Sherwood Gambler, MD 07/14/22 2018

## 2022-07-14 NOTE — ED Provider Triage Note (Signed)
Emergency Medicine Provider Triage Evaluation Note  Jonathan Vaughn , a 47 y.o. male  was evaluated in triage.  Pt complains of concerns for elevated blood sugar.  Patient was recently diagnosed with diabetes on 07/11/2022.  Patient notes he has been compliant with his medications.  Notes that today his blood sugar was in the 200s and notes after he ate some [place] food his blood sugar elevated to the 300s.  Has associated headache, tingling to left forearm (started at 2:30 PM and resolved at 3:40 PM).  Denies chest pain, shortness of breath, numbness, weakness, vision changes, photophobia.  Review of Systems  Positive:  Negative:   Physical Exam  BP (!) 133/90   Pulse 85   Temp 97.8 F (36.6 C) (Oral)   Resp (!) 22   SpO2 97%  Gen:   Awake, no distress, patient laying on bed comfortable on phone Resp:  Normal effort  MSK:   Moves extremities without difficulty  Other:  No focal neurological deficits.  Negative pronator drift.  Strength sensation intact bilateral upper and lower extremities  Medical Decision Making  Medically screening exam initiated at 4:08 PM.  Appropriate orders placed.  Jonathan T Yoshino was informed that the remainder of the evaluation will be completed by another provider, this initial triage assessment does not replace that evaluation, and the importance of remaining in the ED until their evaluation is complete.  Workup initiated   Astrid Vides A, PA-C 07/14/22 1610

## 2022-07-14 NOTE — Telephone Encounter (Signed)
Pt states his blood pressure has been elevated 147/98  119/98  145/113. Blood glucose has been elevated as well. 340-330 range.

## 2022-07-14 NOTE — Discharge Instructions (Signed)
If you develop continued, recurrent, or worsening headache, fever, neck stiffness, vomiting, blurry or double vision, weakness or numbness in your arms or legs, trouble speaking, or any other new/concerning symptoms then return to the ER for evaluation.  

## 2022-07-14 NOTE — ED Triage Notes (Signed)
Patient told to go to ED by PCP due to hyperglycemia, reports CBG >400. Patient also complains of left arm numbness that started  at 1400. Patient is alert, oriented, and in no apparent distress at this time. Patient has no arm drift, no dysarthria, no facial droop.

## 2022-07-15 ENCOUNTER — Encounter: Payer: Self-pay | Admitting: Dietician

## 2022-07-15 ENCOUNTER — Encounter: Payer: 59 | Attending: Adult Health | Admitting: Dietician

## 2022-07-15 VITALS — Ht 74.0 in | Wt 275.0 lb

## 2022-07-15 DIAGNOSIS — E119 Type 2 diabetes mellitus without complications: Secondary | ICD-10-CM | POA: Insufficient documentation

## 2022-07-15 NOTE — Telephone Encounter (Signed)
Can you please schedule pt for an appt?

## 2022-07-15 NOTE — Progress Notes (Signed)
Diabetes Self-Management Education  Visit Type: First/Initial  Appt. Start Time: 1400 Appt. End Time: 1600  07/15/2022  [name], identified by name and date of birth, is a 47 y.o. male with a diagnosis of Diabetes: Type 2.   ASSESSMENT Patient is here today with his mother.   He was newly diagnosed with Type 2 Diabetes 07/11/2021.  He was in the prediabetes range in 2021 with A1C of 5.7%. Covid 06/27/2022 He has been wearing the freestyle libre 3 since 07/13/22.  Sensor reading 210 in the office today which is the lowest reading he has had in the past 2 days. Patient went to the ER last night due to increased sensor reading (HI on the [employer]) and 375 in the ER. Has not worked in the past month due to poor energy, unable to get out of the truck and hunt due to very poor energy. Chronic diarrhea from IBS not worsened by Metformin.  History includes Type 2 Diabetes (06/2022), anxiety, IBS, GERD, gout, HTN, HLD Medications include:  Metformin, lisinopril, atorvastatin Labs:  A1C 9.9% 07/11/2022 increased from 5.7% 08/16/2022 Cholesterol 263, LDL103, HDL 32, Triglycerides 641 on 07/01/2022 CGM:  FreeStyle Libre 3  Weight hx:   74" 275 lbs 07/15/2021 260 UBW 07/14/2021 240 lbs 06/2020  Patient lives with his wife.  His wife follows a gluten free diet and has prediabetes.  Patient does the cooking.  His wife does most of the shopping. 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  Likes to hunt.  Hunting is his main exercise.  He is joining a gym.  Plans to start walking on trails.  Height 6\' 2"  (1.88 m), weight 275 lb (124.7 kg). Body mass index is 35.31 kg/m.   Diabetes Self-Management Education - 07/15/22 1425       Visit Information   Visit Type First/Initial      Initial Visit   Diabetes Type Type 2    Date Diagnosed 06/2022    Are you currently following a meal plan? No    Are you taking your medications as  prescribed? Yes      Health Coping   How would you rate your overall health? Good      Psychosocial Assessment   Patient Belief/Attitude about Diabetes Motivated to manage diabetes    What is the hardest part about your diabetes right now, causing you the most concern, or is the most worrisome to you about your diabetes?   Being active    Self-care barriers None    Self-management support Doctor's office;Family    Other persons present Patient;Family Member    Patient Concerns Nutrition/Meal planning;Glycemic Control;Weight Control;Healthy Lifestyle    Special Needs None    Preferred Learning Style No preference indicated    Learning Readiness Ready    How often do you need to have someone help you when you read instructions, pamphlets, or other written materials from your doctor or pharmacy? 1 - Never    What is the last grade level you completed in school? 12      Pre-Education Assessment   Patient understands the diabetes disease and treatment process. Needs Instruction    Patient understands incorporating nutritional management into lifestyle. Needs Instruction    Patient undertands incorporating physical activity into lifestyle. Needs Instruction    Patient understands using medications safely. Needs Instruction    Patient understands monitoring blood glucose, interpreting and using results Needs Instruction  Patient understands prevention, detection, and treatment of acute complications. Needs Instruction    Patient understands prevention, detection, and treatment of chronic complications. Needs Instruction    Patient understands how to develop strategies to address psychosocial issues. Needs Instruction    Patient understands how to develop strategies to promote health/change behavior. Needs Instruction      Complications   Last HgB A1C per patient/outside source 9.9 %   07/11/2022 increased from 5.7% in 2021   How often do you check your blood sugar? > 4 times/day    Fasting  Blood glucose range (mg/dL) >200    Postprandial Blood glucose range (mg/dL) >200    Number of hypoglycemic episodes per month 0    Number of hyperglycemic episodes ( >200mg /dL): Daily    Can you tell when your blood sugar is high? Yes    What do you do if your blood sugar is high? drinks water    Have you had a dilated eye exam in the past 12 months? Yes    Have you had a dental exam in the past 12 months? No    Are you checking your feet? No      Dietary Intake   Breakfast leftover greek salad, apple    Lunch grilled chicken wrapped in lettuce    Dinner [place] sandwich with mustard, applesauce, pinto beans, slaw OR sauteed chicken and broccoli    Beverage(s) water, unsweetened tea, rare regular hot cocoa      Activity / Exercise   Activity / Exercise Type ADL's      Patient Education   Previous Diabetes Education No    Disease Pathophysiology Definition of diabetes, type 1 and 2, and the diagnosis of diabetes;Factors that contribute to the development of diabetes    Healthy Eating Role of diet in the treatment of diabetes and the relationship between the three main macronutrients and blood glucose level;Food label reading, portion sizes and measuring food.;Plate Method;Carbohydrate counting;Meal options for control of blood glucose level and chronic complications.;Meal timing in regards to the patients' current diabetes medication.    Being Active Role of exercise on diabetes management, blood pressure control and cardiac health.    Medications Reviewed patients medication for diabetes, action, purpose, timing of dose and side effects.    Monitoring Taught/evaluated CGM (comment);Identified appropriate SMBG and/or A1C goals.;Taught/discussed recording of test results and interpretation of SMBG.;Daily foot exams;Yearly dilated eye exam    Acute complications Taught prevention, symptoms, and  treatment of hypoglycemia - the 15 rule.;Discussed and identified patients' prevention, symptoms,  and treatment of hyperglycemia.    Chronic complications Relationship between chronic complications and blood glucose control;Assessed and discussed foot care and prevention of foot problems;Dental care;Lipid levels, blood glucose control and heart disease    Diabetes Stress and Support Identified and addressed patients feelings and concerns about diabetes;Worked with patient to identify barriers to care and solutions;Role of stress on diabetes      Individualized Goals (developed by patient)   Nutrition Follow meal plan discussed    Physical Activity Exercise 5-7 days per week;30 minutes per day    Medications take my medication as prescribed    Monitoring  Consistenly use CGM    Problem Solving Eating Pattern    Reducing Risk examine blood glucose patterns;do foot checks daily      Post-Education Assessment   Patient understands the diabetes disease and treatment process. Demonstrates understanding / competency    Patient understands incorporating nutritional management into lifestyle. Comprehends key points  Patient undertands incorporating physical activity into lifestyle. Comprehends key points    Patient understands using medications safely. Demonstrates understanding / competency    Patient understands monitoring blood glucose, interpreting and using results Comprehends key points    Patient understands prevention, detection, and treatment of acute complications. Demonstrates understanding / competency    Patient understands prevention, detection, and treatment of chronic complications. Demonstrates understanding / competency    Patient understands how to develop strategies to address psychosocial issues. Demonstrates understanding / competency    Patient understands how to develop strategies to promote health/change behavior. Comprehends key points      Outcomes   Expected Outcomes Demonstrated interest in learning. Expect positive outcomes    Future DMSE PRN   recommended f/u in  1-2 months but patient will be out of town for an indefinate period and will call when he is ready   Program Status Completed             Individualized Plan for Diabetes Self-Management Training:   Learning Objective:  Patient will have a greater understanding of diabetes self-management. Patient education plan is to attend individual and/or group sessions per assessed needs and concerns.   Plan:   Patient Instructions  [employer] job on changes made!! Aim to be more active.  Aim for 30 minutes or more most days of the week. Choose lean and less fat.   Eat more Non-Starchy Vegetables These include greens, broccoli, cauliflower, cabbage, carrots, beets, eggplant, peppers, squash and others. Minimize added sugars and refined grains Rethink what you drink.  Choose beverages without added sugar.  Look for 0 carbs on the label. See the list of whole grains below.  Find alternatives to usual sweet treats. Choose whole foods over processed. Make simple meals at home more often than eating out.  Tips to increase fiber in your diet: (All plants have fiber.  Eat a variety. There are more than are on this list.) Slowly increase the amount of fiber you eat to 25-35 grams per day.  (More is fine if you tolerate it.) Fiber from whole grains, nuts and seeds Quinoa, 1/2 cup = 5 grams Bulgur, 1/2 cup = 4.1 grams Popcorn, 3 cups = 3.6 grams Whole Wheat Spaghetti, 1/2 cup = 3.2 grams Barley, 1/2 cup = 3 grams Oatmeal, 1/2 cup = 2 grams Whole Wheat English Muffin = 3 grams Corn, 1/2 cup = 2.1 grams Brown Rice, 1/2 cup = 1.8 grams Flax seeds, 1 Tablespoon = 2.8 grams Chia seeds, 1 Tablespoon = 11 grams Almonds, 1 ounce = 3.5 grams fiber Fiber from legumes Kidney beans, 1/2 cup 7.9 grams Lentils, 1/2 cup = 7.8 grams Pinto beans, 1/2 cup = 7.7 grams Black beans, 1/2 cup = 7.6 grams Lima beans, 1/2 cup 6.4 grams Chick peas, 1/2 cup = 5.3 grams Black eyed peas, 1/2 cup = 4 grams Fiber from  fruits and vegetables Pear, 6 grams Apple. 3.3 grams Raspberries or Blackberries, 3/4 cup = 6 grams Strawberries or Blueberries, 1 cup = 3.4 grams Baked sweet potato 3.8 grams fiber Baked potato with skin 4.4 grams  Peas, 1/2 cup = 4.4 grams  Spinach, 1/2 cup cooked = 3.5 grams  Avocado, 1/2 = 5 grams      Expected Outcomes:  Demonstrated interest in learning. Expect positive outcomes Education material provided: ADA - How to Thrive: A Guide for Your Journey with Diabetes, Food label handouts, Meal plan card, Snack sheet, and Diabetes Resources, [employer] of Lifestyle Medicine) packet  If problems or questions, patient to contact team via:  Phone Future DSME appointment: PRN (recommended f/u in 1-2 months but patient will be out of town for an indefinate period and will call when he is ready)

## 2022-07-15 NOTE — Patient Instructions (Signed)
Great job on changes made!! Aim to be more active.  Aim for 30 minutes or more most days of the week. Choose lean and less fat.   Eat more Non-Starchy Vegetables These include greens, broccoli, cauliflower, cabbage, carrots, beets, eggplant, peppers, squash and others. Minimize added sugars and refined grains Rethink what you drink.  Choose beverages without added sugar.  Look for 0 carbs on the label. See the list of whole grains below.  Find alternatives to usual sweet treats. Choose whole foods over processed. Make simple meals at home more often than eating out.  Tips to increase fiber in your diet: (All plants have fiber.  Eat a variety. There are more than are on this list.) Slowly increase the amount of fiber you eat to 25-35 grams per day.  (More is fine if you tolerate it.) Fiber from whole grains, nuts and seeds Quinoa, 1/2 cup = 5 grams Bul[place]ur, 1/2 cup = 4.1 grams Popcorn, 3 cups = 3.6 grams Whole Wheat Spaghetti, 1/2 cup = 3.2 grams Barley, 1/2 cup = 3 grams Oatmeal, 1/2 cup = 2 grams Whole Wheat English Muffin = 3 grams Corn, 1/2 cup = 2.1 grams Brown Rice, 1/2 cup = 1.8 grams Flax seeds, 1 Tablespoon = 2.8 grams Chia seeds, 1 Tablespoon = 11 grams Almonds, 1 ounce = 3.5 grams fiber Fiber from legumes Kidney beans, 1/2 cup 7.9 grams Lentils, 1/2 cup = 7.8 grams Pinto beans, 1/2 cup = 7.7 grams Black beans, 1/2 cup = 7.6 grams Lima beans, 1/2 cup 6.4 grams Chick peas, 1/2 cup = 5.3 grams Black eyed peas, 1/2 cup = 4 grams Fiber from fruits and vegetables Pear, 6 grams Apple. 3.3 grams Raspberries or Blackberries, 3/4 cup = 6 grams Strawberries or Blueberries, 1 cup = 3.4 grams Baked sweet potato 3.8 grams fiber Baked potato with skin 4.4 grams  Peas, 1/2 cup = 4.4 grams  Spinach, 1/2 cup cooked = 3.5 grams  Avocado, 1/2 = 5 grams

## 2022-07-17 ENCOUNTER — Ambulatory Visit (INDEPENDENT_AMBULATORY_CARE_PROVIDER_SITE_OTHER): Payer: 59 | Admitting: Family Medicine

## 2022-07-17 VITALS — BP 134/80 | HR 87 | Temp 98.1°F | Ht 74.0 in | Wt 273.4 lb

## 2022-07-17 DIAGNOSIS — I10 Essential (primary) hypertension: Secondary | ICD-10-CM | POA: Diagnosis not present

## 2022-07-17 DIAGNOSIS — E119 Type 2 diabetes mellitus without complications: Secondary | ICD-10-CM

## 2022-07-17 NOTE — Progress Notes (Signed)
   Established Patient Office Visit   Subjective  Patient ID: Jonathan Vaughn, male    DOB: 1976-05-14  Age: 47 y.o. MRN: [id number]  Chief Complaint  Patient presents with   Diabetes    Pt is here to follow up from ED visit for high blood sugar.     HPI Pt is a 47 year old male followed by Dorothyann Peng, NP and seen follow-up.  Pt seen 07/11/22 by pcp, started on metformin 500 mg twice daily and lisinopril 10 mg for new onset DM and HTN.  Hgb A1C was 9.9%.  Given Rx for freestyle libre and labs obtained.  Advised to follow-up in 1 month.  Patient went to ED on 07/14/2022 for hyperglycemia.  States blood sugar was over 400.  No DKA noted.  Given fluids and headache cocktail.  Advised to follow-up with PCP.  Patient seen by nutrition 07/15/2022.  Patient seen today as concerned about blood sugar.  States making lifestyle changes.  Inquires about current medication.     Review of Systems  Constitutional:  Positive for malaise/fatigue.  Psychiatric/Behavioral:  The patient has insomnia.    Negative unless stated above    Objective:     BP 134/80 (BP Location: Right Arm, Patient Position: Sitting, Cuff Size: Large)   Pulse 87   Temp 98.1 F (36.7 C) (Oral)   Ht 6' 2"$  (1.88 m)   Wt 273 lb 6.4 oz (124 kg)   SpO2 96%   BMI 35.10 kg/m    Physical Exam Constitutional:      Appearance: Normal appearance. He is not ill-appearing.  HENT:     Head: Normocephalic and atraumatic.     Nose: Nose normal.     Mouth/Throat:     Mouth: Mucous membranes are moist.  Eyes:     Extraocular Movements: Extraocular movements intact.     Conjunctiva/sclera: Conjunctivae normal.     Pupils: Pupils are equal, round, and reactive to light.  Cardiovascular:     Rate and Rhythm: Normal rate and regular rhythm.     Heart sounds: Normal heart sounds.  Pulmonary:     Effort: Pulmonary effort is normal.     Breath sounds: Normal breath sounds. No wheezing, rhonchi or rales.  Musculoskeletal:         General: Normal range of motion.     Right shoulder: No swelling or crepitus.     Left shoulder: No swelling.  Skin:    General: Skin is warm and dry.  Neurological:     Mental Status: He is alert and oriented to person, place, and time. Mental status is at baseline.      No results found for any visits on 07/17/22.    Assessment & Plan:  Jonathan was seen today for diabetes.  Diagnoses and all orders for this visit:  New onset type 2 diabetes mellitus ([place]) -Hemoglobin A1c 9.9% on 07/11/2022  Essential hypertension   Continue lisinopril 10 mg daily as BP improving.  Also advised to continue metformin.  Increase and/or additional medication added to regimen.  Patient encouraged to increase p.o. intake of water and continue diet and exercise.  Will have patient follow-up with PCP in the next 1-2 weeks.  Given precautions.   Billie Ruddy, MD

## 2022-07-30 ENCOUNTER — Encounter: Payer: Self-pay | Admitting: Adult Health

## 2022-07-30 ENCOUNTER — Other Ambulatory Visit: Payer: Self-pay

## 2022-07-30 ENCOUNTER — Telehealth: Payer: Self-pay | Admitting: Adult Health

## 2022-07-30 DIAGNOSIS — E1169 Type 2 diabetes mellitus with other specified complication: Secondary | ICD-10-CM

## 2022-07-30 MED ORDER — METFORMIN HCL 500 MG PO TABS: 500.0000 mg | ORAL_TABLET | Freq: Two times a day (BID) | ORAL | 1 refills | Status: DC

## 2022-07-30 NOTE — Telephone Encounter (Signed)
Patient states his med dosage was increased which caused him to run out of meds sooner. Requesting a refill metFORMIN (GLUCOPHAGE) 500 MG tablet  [id number] - [place], [place] [address] Phone: [phone]  Fax: [phone]

## 2022-07-30 NOTE — Telephone Encounter (Signed)
Prescription sent to pharmacy. No other action needed.  

## 2022-08-01 ENCOUNTER — Telehealth: Payer: Self-pay | Admitting: Adult Health

## 2022-08-01 DIAGNOSIS — E1169 Type 2 diabetes mellitus with other specified complication: Secondary | ICD-10-CM

## 2022-08-01 MED ORDER — METFORMIN HCL 500 MG PO TABS: 1000.0000 mg | ORAL_TABLET | Freq: Two times a day (BID) | ORAL | 1 refills | Status: DC

## 2022-08-01 NOTE — Telephone Encounter (Signed)
Please advise 

## 2022-08-01 NOTE — Telephone Encounter (Signed)
Rx refilled.

## 2022-08-01 NOTE — Telephone Encounter (Signed)
Pt is calling and pt saw dr banks on 07-17-2022 and per pt dr banks told him to take 2 metformin 500 mg in mornings and 2 metformin 500 mg in evenings. Pt take a total of 4 metformin a day and will need new rx . Pt did pick up metformin on 07-30-2022  [id number] [place] [address]: [phone] Fax: [phone]

## 2022-08-04 ENCOUNTER — Ambulatory Visit
Admission: RE | Admit: 2022-08-04 | Discharge: 2022-08-04 | Disposition: A | Discharge status: Home / Self Care | Payer: 59 | Source: Ambulatory Visit | Attending: Physician Assistant | Admitting: Physician Assistant

## 2022-08-04 VITALS — BP 138/75 | HR 80 | Temp 97.8°F | Resp 16

## 2022-08-04 DIAGNOSIS — M10472 Other secondary gout, left ankle and foot: Secondary | ICD-10-CM | POA: Diagnosis not present

## 2022-08-04 MED ORDER — DEXAMETHASONE SODIUM PHOSPHATE 10 MG/ML IJ SOLN
10.0000 mg | Freq: Once | INTRAMUSCULAR | Status: AC
  Administered 2022-08-04: 10 mg via INTRAMUSCULAR

## 2022-08-04 NOTE — ED Provider Notes (Signed)
[facility]    CSN: [id number] Arrival date & time: 08/04/22  1546      History   Chief Complaint Chief Complaint  Patient presents with   Foot Pain    Gout - Entered by patient   gout/low sugar    HPI Jonathan Vaughn is a 47 y.o. male.   Patient here today for evaluation of left sided foot pain that started when he woke yesterday. Pain seemed to worsen last night. He notes pain is present to his lateral forefoot. He typically will have gout flares to his first MTP or his knee but states the pain feels similar. He does not report any injury. He had stopped taking allopurinol. He did take a hydrocodone his mother gave him with mild relief. He notes typically he will have significant improvement with steroid injection. He does admit to eating a large steak two nights prior to symptom onset.   He also notes low readings to his CGM last night. He denies any symptoms with same. He does report possibly pulling sensor partially out before he started having unusually low readings.   The history is provided by the patient.  Foot Pain Pertinent negatives include no shortness of breath.    Past Medical History:  Diagnosis Date   Diabetes mellitus without complication ([place])    False positive QuantiFERON-TB Gold test    GERD 03/21/2010   Qualifier: Diagnosis of  By: Nelson-Smith CMA (AAMA), Dottie     Gout    Headache(784.0) 01/10/2009   Qualifier: Diagnosis of  By: Valma Cava LPN, Nancy     Incarcerated incisional hernia 11/22/2018   LATERAL EPICONDYLITIS OF ELBOW 08/03/2009   Qualifier: Diagnosis of  By: Elease Hashimoto MD, Loleta Chance PAIN 01/23/2010   Qualifier: Diagnosis of  By: Valma Cava LPN, Izora Gala      Patient Active Problem List   Diagnosis Date Noted   Pain in right foot 11/20/2020   Mixed hyperlipidemia 10/25/2019   Incarcerated incisional hernia 11/22/2018   Tobacco use disorder 11/26/2016   Gout 11/26/2016   GERD 03/21/2010   GASTRITIS 03/21/2010    SHOULDER PAIN 01/23/2010   LATERAL EPICONDYLITIS OF ELBOW 08/03/2009   HEADACHE 01/10/2009    Past Surgical History:  Procedure Laterality Date   CHOLECYSTECTOMY N/A 09/03/2016   Procedure: LAPAROSCOPIC CHOLECYSTECTOMY;  Surgeon: Rolm Bookbinder, MD;  Location: [facility];  Service: General;  Laterality: N/A;   INSERTION OF MESH N/A 11/22/2018   Procedure: Insertion Of Mesh;  Surgeon: Fanny Skates, MD;  Location: [place];  Service: General;  Laterality: N/A;   TONSILLECTOMY     VENTRAL HERNIA REPAIR N/A 11/22/2018   Procedure: LAPAROSCOPIC REPAIR VENTRAL HERNIA WITH MESH;  Surgeon: Fanny Skates, MD;  Location: [facility];  Service: General;  Laterality: N/A;       Home Medications    Prior to Admission medications   Medication Sig Start Date End Date Taking? Authorizing Provider  allopurinol (ZYLOPRIM) 100 MG tablet Take 100 mg by mouth daily.    [provider]  Continuous Blood Gluc Sensor (FREESTYLE LIBRE 3 SENSOR) MISC 1 Device by Does not apply route every 14 (fourteen) days. Place 1 sensor on the skin every 14 days. Use to check glucose continuously 07/11/22   Nafziger, Tommi Rumps, NP  lisinopril (ZESTRIL) 10 MG tablet Take 1 tablet (10 mg total) by mouth daily. 07/11/22   Nafziger, Tommi Rumps, NP  metFORMIN (GLUCOPHAGE) 500 MG tablet Take 2 tablets (1,000 mg total) by mouth 2 (  two) times daily with a meal. 08/01/22 10/30/22  Nafziger, Tommi Rumps, NP  rosuvastatin (CRESTOR) 20 MG tablet Take 1 tablet (20 mg total) by mouth daily. 07/11/22   Nafziger, Tommi Rumps, NP    Family History Family History  Problem Relation Age of Onset   Crohn's disease Other    Gout Father     Social History Social History   Tobacco Use   Smoking status: Former    Packs/day: 3.00    Years: 15.00    Total pack years: 45.00    Types: Cigarettes    Quit date: 06/23/2016    Years since quitting: 6.1   Smokeless tobacco: Never  Substance Use Topics   Alcohol use: Not Currently    Comment: pt states he no longer drinks-  quit in 2018   Drug use: No     Allergies   Reglan [metoclopramide] and Azithromycin   Review of Systems Review of Systems  Constitutional:  Negative for chills and fever.  Eyes:  Negative for discharge and redness.  Respiratory:  Negative for shortness of breath.   Gastrointestinal:  Negative for nausea and vomiting.  Musculoskeletal:  Positive for arthralgias.  Skin:  Negative for color change and wound.  Neurological:  Negative for numbness.     Physical Exam Triage Vital Signs ED Triage Vitals [08/04/22 1618]  Enc Vitals Group     BP 138/75     Pulse Rate 80     Resp 16     Temp 97.8 F (36.6 C)     Temp Source Oral     SpO2 98 %     Weight      Height      Head Circumference      Peak Flow      Pain Score 4     Pain Loc      Pain Edu?      Excl. in [place]?    No data found.  Updated Vital Signs BP 138/75 (BP Location: Left Arm)   Pulse 80   Temp 97.8 F (36.6 C) (Oral)   Resp 16   SpO2 98%   Physical Exam Vitals and nursing note reviewed.  Constitutional:      General: He is not in acute distress.    Appearance: Normal appearance. He is not ill-appearing.  HENT:     Head: Normocephalic and atraumatic.  Eyes:     Conjunctiva/sclera: Conjunctivae normal.  Cardiovascular:     Rate and Rhythm: Normal rate.  Pulmonary:     Effort: Pulmonary effort is normal. No respiratory distress.  Musculoskeletal:     Comments: No significant swelling, erythema noted to left foot  Neurological:     Mental Status: He is alert.  Psychiatric:        Mood and Affect: Mood normal.        Behavior: Behavior normal.        Thought Content: Thought content normal.      UC Treatments / Results  Labs (all labs ordered are listed, but only abnormal results are displayed) Labs Reviewed - No data to display  EKG   Radiology No results found.  Procedures Procedures (including critical care time)  Medications Ordered in UC Medications  dexamethasone  (DECADRON) injection 10 mg (10 mg Intramuscular Given 08/04/22 1644)    Initial Impression / Assessment and Plan / UC Course  I have reviewed the triage vital signs and the nursing notes.  Pertinent labs & imaging results that were available  during my care of the patient were reviewed by me and considered in my medical decision making (see chart for details).   Steroid injection administered in office. Discussed resuming allopurinol once gout flare has resolved. Encouraged follow up with any further concerns.   Recommend follow up with PCP regarding glucose monitoring. Rechecked CBG in office with reading of 200 while sensor was reading 70. Patient notes that he is to have sensor changed in 6 days. Low suspicion for actual hypoglycemia as patient is only taking metformin and was asymptomatic. Recommended double checking low readings with traditional glucometer and sooner follow up with any concerns.    Final Clinical Impressions(s) / UC Diagnoses   Final diagnoses:  Other secondary acute gout of left foot   Discharge Instructions   None    ED Prescriptions   None    PDMP not reviewed this encounter.   Francene Finders, PA-C 08/04/22 1737

## 2022-08-04 NOTE — ED Triage Notes (Signed)
Pt c/o possible gout attack to outer edge of left foot. States took a hydrocodone from one of his parents b/c pain was so bad last night. Onset ~ yesterday.   Blood sugar recently dx w/ DM2 states wears implantable device and alarm showed his BS was in 50's last night, after [employer] cookies increased to 70's.

## 2022-08-08 DIAGNOSIS — R0602 Shortness of breath: Secondary | ICD-10-CM | POA: Diagnosis not present

## 2022-08-08 DIAGNOSIS — T782XXA Anaphylactic shock, unspecified, initial encounter: Secondary | ICD-10-CM | POA: Diagnosis not present

## 2022-08-08 DIAGNOSIS — T7840XA Allergy, unspecified, initial encounter: Secondary | ICD-10-CM | POA: Diagnosis not present

## 2022-08-09 DIAGNOSIS — T782XXA Anaphylactic shock, unspecified, initial encounter: Secondary | ICD-10-CM | POA: Diagnosis not present

## 2022-08-12 ENCOUNTER — Ambulatory Visit (INDEPENDENT_AMBULATORY_CARE_PROVIDER_SITE_OTHER): Payer: 59 | Admitting: Adult Health

## 2022-08-12 VITALS — BP 120/70 | HR 67 | Temp 97.7°F | Ht 74.0 in | Wt 267.0 lb

## 2022-08-12 DIAGNOSIS — I10 Essential (primary) hypertension: Secondary | ICD-10-CM | POA: Diagnosis not present

## 2022-08-12 DIAGNOSIS — E1169 Type 2 diabetes mellitus with other specified complication: Secondary | ICD-10-CM

## 2022-08-12 DIAGNOSIS — T782XXD Anaphylactic shock, unspecified, subsequent encounter: Secondary | ICD-10-CM

## 2022-08-12 LAB — GLUCOSE, POCT (MANUAL RESULT ENTRY): POC Glucose: 214 mg/dl — AB (ref 70–99)

## 2022-08-12 MED ORDER — TRULICITY 0.75 MG/0.5ML ~~LOC~~ SOAJ: 0.7500 mg | SUBCUTANEOUS | 1 refills | Status: DC

## 2022-08-12 NOTE — Patient Instructions (Signed)
It was great seeing you today   I am going to refer you an allergist and I have also prescribed an additional diabetic medication called Trulicity - do this once a week   We will have you follow up in 2 months for recheck of A1c

## 2022-08-12 NOTE — Progress Notes (Addendum)
Subjective:    Patient ID: Jonathan Vaughn, male    DOB: 1976-05-02, 48 y.o.   MRN: [id number]  HPI 47 year old male who  has a past medical history of Diabetes mellitus without complication ([place]), False positive QuantiFERON-TB Gold test, GERD (03/21/2010), Gout, Headache(784.0) (01/10/2009), Incarcerated incisional hernia (11/22/2018), LATERAL EPICONDYLITIS OF ELBOW (08/03/2009), and SHOULDER PAIN (01/23/2010).  He presents to the office today for 1 month follow-up regarding diabetes mellitus type 2.  When he was seen a month ago he was experiencing polyuria and polydipsia for roughly 2 weeks.  Lab Results  Component Value Date   HGBA1C 9.9 (A) 07/11/2022   A1c was 9.9 at this time.  He was started on metformin 1087m twice daily, referred to nutrition and diabetic education and prescribed a libre 3 sensor.  Blood pressure was also elevated at 140/82 and he was started on lisinopril 10 mg daily.  Unfortunately, 4 days ago he was hunting rabbits in the woods, at lunchtime he had eaten a ham sandwich and within the next 10 to 15 minutes he had had an onset of hives, swelling of his tongue, shortness of breath, dizziness, and hypotension.  EMS was called and he was able to get a subcu epinephrine injection and was brought to the hospital.  While in the emergency room he was found to be covered in hives but was not in respiratory distress at this time.  He was kept overnight for observation and then discharged home in good condition.  It was thought that lisinopril could have been a causative factor.  He was instructed in the attending hospitalist to not take his lisinopril until he was seen for follow-up.  Wt Readings from Last 3 Encounters:  08/12/22 267 lb (121.1 kg)  07/17/22 273 lb 6.4 oz (124 kg)  07/15/22 275 lb (124.7 kg)    Today he reports that he has started eating better and has joined a gym. His blood sugars have dropped slightly. He is using the LBristow3 sensor to monitor his  blood sugars with TIM at 48%. No hypoglycemic episodes.  He has not taken then any of the lisinopril since being admitted.   Review of Systems See HPI  Past Medical History:  Diagnosis Date   Diabetes mellitus without complication ([place]    False positive QuantiFERON-TB Gold test    GERD 03/21/2010   Qualifier: Diagnosis of  By: Nelson-Smith CMA (AAMA), Dottie     Gout    Headache(784.0) 01/10/2009   Qualifier: Diagnosis of  By: NValma CavaLPN, Nancy     Incarcerated incisional hernia 11/22/2018   LATERAL EPICONDYLITIS OF ELBOW 08/03/2009   Qualifier: Diagnosis of  By: BElease HashimotoMD, BLoleta ChancePAIN 01/23/2010   Qualifier: Diagnosis of  By: NValma CavaLPN, NIzora Gala     Social History   Socioeconomic History   Marital status: Married    Spouse name: Not on file   Number of children: Not on file   Years of education: Not on file   Highest education level: Not on file  Occupational History   Not on file  Tobacco Use   Smoking status: Former    Packs/day: 3.00    Years: 15.00    Total pack years: 45.00    Types: Cigarettes    Quit date: 06/23/2016    Years since quitting: 6.1   Smokeless tobacco: Never  Substance and Sexual Activity   Alcohol use: Not Currently    Comment: pt  states he no longer drinks- quit in 2018   Drug use: No   Sexual activity: Not on file  Other Topics Concern   Not on file  Social History Narrative   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    Married    Has children       Social Determinants of Health   Financial Resource Strain: Not on file  Food Insecurity: Not on file  Transportation Needs: Not on file  Physical Activity: Not on file  Stress: Not on file  Social Connections: Not on file  Intimate Partner Violence: Not on file    Past Surgical History:  Procedure Laterality Date   CHOLECYSTECTOMY N/A 09/03/2016   Procedure: LAPAROSCOPIC CHOLECYSTECTOMY;  Surgeon: Rolm Bookbinder, MD;  Location: [facility];  Service: General;   Laterality: N/A;   INSERTION OF MESH N/A 11/22/2018   Procedure: Insertion Of Mesh;  Surgeon: Fanny Skates, MD;  Location: [place];  Service: General;  Laterality: N/A;   TONSILLECTOMY     VENTRAL HERNIA REPAIR N/A 11/22/2018   Procedure: LAPAROSCOPIC REPAIR VENTRAL HERNIA WITH MESH;  Surgeon: Fanny Skates, MD;  Location: [place];  Service: General;  Laterality: N/A;    Family History  Problem Relation Age of Onset   Crohn's disease Other    Gout Father     Allergies  Allergen Reactions   Reglan [Metoclopramide] Other (See Comments)    Tick, twitching, jittery    Azithromycin Rash    Current Outpatient Medications on File Prior to Visit  Medication Sig Dispense Refill   allopurinol (ZYLOPRIM) 100 MG tablet Take 100 mg by mouth daily.     Continuous Blood Gluc Sensor (FREESTYLE LIBRE 3 SENSOR) MISC 1 Device by Does not apply route every 14 (fourteen) days. Place 1 sensor on the skin every 14 days. Use to check glucose continuously 2 each 6   lisinopril (ZESTRIL) 10 MG tablet Take 1 tablet (10 mg total) by mouth daily. 90 tablet 3   metFORMIN (GLUCOPHAGE) 500 MG tablet Take 2 tablets (1,000 mg total) by mouth 2 (two) times daily with a meal. 180 tablet 1   rosuvastatin (CRESTOR) 20 MG tablet Take 1 tablet (20 mg total) by mouth daily. 90 tablet 3   No current facility-administered medications on file prior to visit.    BP 120/70   Pulse 67   Temp 97.7 F (36.5 C) (Oral)   Ht 6' 2"$  (1.88 m)   Wt 267 lb (121.1 kg)   SpO2 97%   BMI 34.28 kg/m      Objective:   Physical Exam Vitals and nursing note reviewed.  Constitutional:      Appearance: Normal appearance.  Cardiovascular:     Rate and Rhythm: Normal rate and regular rhythm.     Pulses: Normal pulses.     Heart sounds: Normal heart sounds.  Pulmonary:     Effort: Pulmonary effort is normal.     Breath sounds: Normal breath sounds.  Musculoskeletal:        General: Normal range of motion.  Skin:    General: Skin  is warm and dry.  Neurological:     General: No focal deficit present.     Mental Status: He is alert and oriented to person, place, and time.  Psychiatric:        Mood and Affect: Mood normal.        Behavior: Behavior normal.        Thought Content: Thought content normal.  Judgment: Judgment normal.       Assessment & Plan:  1. Type 2 diabetes mellitus with other specified complication, without long-term current use of insulin (HCC) - Add Trulicity [id number] mg weekly. Continue with metformin  - Dulaglutide (TRULICITY) [id number] 0000000 SOPN; Inject 0.75 mg into the skin once a week.  Dispense: 2 mL; Refill: 1 - POC Glucose (CBG)- 214   2. Essential hypertension - At goal without medication. Will hold off on starting anything new today   3. Anaphylaxis, subsequent encounter  - Ambulatory referral to Allergy  Dorothyann Peng, NP

## 2022-10-05 ENCOUNTER — Other Ambulatory Visit: Payer: Self-pay | Admitting: Adult Health

## 2022-10-05 DIAGNOSIS — E1169 Type 2 diabetes mellitus with other specified complication: Secondary | ICD-10-CM

## 2022-10-15 ENCOUNTER — Encounter (HOSPITAL_COMMUNITY): Payer: Self-pay | Admitting: Emergency Medicine

## 2022-10-15 ENCOUNTER — Emergency Department (HOSPITAL_COMMUNITY): Payer: 59

## 2022-10-15 ENCOUNTER — Inpatient Hospital Stay (HOSPITAL_COMMUNITY)
Admission: EM | Admit: 2022-10-15 | Discharge: 2022-10-16 | DRG: 200 | Disposition: A | Discharge status: Home / Self Care | Payer: 59 | Attending: General Surgery | Admitting: General Surgery

## 2022-10-15 ENCOUNTER — Ambulatory Visit
Admission: RE | Admit: 2022-10-15 | Discharge: 2022-10-15 | Disposition: A | Discharge status: Home / Self Care | Payer: 59 | Source: Ambulatory Visit | Attending: Adult Health | Admitting: Adult Health

## 2022-10-15 ENCOUNTER — Other Ambulatory Visit: Payer: Self-pay

## 2022-10-15 DIAGNOSIS — Z87891 Personal history of nicotine dependence: Secondary | ICD-10-CM

## 2022-10-15 DIAGNOSIS — Y9389 Activity, other specified: Secondary | ICD-10-CM

## 2022-10-15 DIAGNOSIS — Z888 Allergy status to other drugs, medicaments and biological substances status: Secondary | ICD-10-CM | POA: Diagnosis not present

## 2022-10-15 DIAGNOSIS — E119 Type 2 diabetes mellitus without complications: Secondary | ICD-10-CM | POA: Diagnosis not present

## 2022-10-15 DIAGNOSIS — Z7985 Long-term (current) use of injectable non-insulin antidiabetic drugs: Secondary | ICD-10-CM

## 2022-10-15 DIAGNOSIS — I10 Essential (primary) hypertension: Secondary | ICD-10-CM | POA: Diagnosis not present

## 2022-10-15 DIAGNOSIS — S270XXA Traumatic pneumothorax, initial encounter: Secondary | ICD-10-CM | POA: Diagnosis not present

## 2022-10-15 DIAGNOSIS — Z881 Allergy status to other antibiotic agents status: Secondary | ICD-10-CM

## 2022-10-15 DIAGNOSIS — Z79899 Other long term (current) drug therapy: Secondary | ICD-10-CM

## 2022-10-15 DIAGNOSIS — S2242XA Multiple fractures of ribs, left side, initial encounter for closed fracture: Secondary | ICD-10-CM

## 2022-10-15 DIAGNOSIS — J939 Pneumothorax, unspecified: Secondary | ICD-10-CM | POA: Diagnosis present

## 2022-10-15 DIAGNOSIS — K219 Gastro-esophageal reflux disease without esophagitis: Secondary | ICD-10-CM | POA: Diagnosis not present

## 2022-10-15 DIAGNOSIS — S7002XA Contusion of left hip, initial encounter: Secondary | ICD-10-CM | POA: Diagnosis not present

## 2022-10-15 DIAGNOSIS — W19XXXA Unspecified fall, initial encounter: Principal | ICD-10-CM

## 2022-10-15 DIAGNOSIS — Z9049 Acquired absence of other specified parts of digestive tract: Secondary | ICD-10-CM | POA: Diagnosis not present

## 2022-10-15 DIAGNOSIS — E785 Hyperlipidemia, unspecified: Secondary | ICD-10-CM | POA: Diagnosis present

## 2022-10-15 DIAGNOSIS — Y990 Civilian activity done for income or pay: Secondary | ICD-10-CM

## 2022-10-15 DIAGNOSIS — W11XXXA Fall on and from ladder, initial encounter: Secondary | ICD-10-CM | POA: Diagnosis not present

## 2022-10-15 DIAGNOSIS — Z7984 Long term (current) use of oral hypoglycemic drugs: Secondary | ICD-10-CM | POA: Diagnosis not present

## 2022-10-15 DIAGNOSIS — R0781 Pleurodynia: Secondary | ICD-10-CM | POA: Diagnosis not present

## 2022-10-15 LAB — CBC WITH DIFFERENTIAL/PLATELET
Abs Immature Granulocytes: 0.04 10*3/uL (ref 0.00–0.07)
Basophils Absolute: 0.1 10*3/uL (ref 0.0–0.1)
Basophils Relative: 1 %
Eosinophils Absolute: 0.1 10*3/uL (ref 0.0–0.5)
Eosinophils Relative: 1 %
HCT: 44.1 % (ref 39.0–52.0)
Hemoglobin: 14.5 g/dL (ref 13.0–17.0)
Immature Granulocytes: 0 %
Lymphocytes Relative: 15 %
Lymphs Abs: 1.6 10*3/uL (ref 0.7–4.0)
MCH: 29.2 pg (ref 26.0–34.0)
MCHC: 32.9 g/dL (ref 30.0–36.0)
MCV: 88.7 fL (ref 80.0–100.0)
Monocytes Absolute: 0.7 10*3/uL (ref 0.1–1.0)
Monocytes Relative: 6 %
Neutro Abs: 8.5 10*3/uL — ABNORMAL HIGH (ref 1.7–7.7)
Neutrophils Relative %: 77 %
Platelets: 281 10*3/uL (ref 150–400)
RBC: 4.97 MIL/uL (ref 4.22–5.81)
RDW: 13.8 % (ref 11.5–15.5)
WBC: 10.9 10*3/uL — ABNORMAL HIGH (ref 4.0–10.5)
nRBC: 0 % (ref 0.0–0.2)

## 2022-10-15 LAB — CBC
HCT: 39.5 % (ref 39.0–52.0)
Hemoglobin: 13.4 g/dL (ref 13.0–17.0)
MCH: 29.3 pg (ref 26.0–34.0)
MCHC: 33.9 g/dL (ref 30.0–36.0)
MCV: 86.4 fL (ref 80.0–100.0)
Platelets: 220 10*3/uL (ref 150–400)
RBC: 4.57 MIL/uL (ref 4.22–5.81)
RDW: 13.8 % (ref 11.5–15.5)
WBC: 8.1 10*3/uL (ref 4.0–10.5)
nRBC: 0 % (ref 0.0–0.2)

## 2022-10-15 LAB — I-STAT CHEM 8, ED
BUN: 17 mg/dL (ref 6–20)
Calcium, Ion: 1.16 mmol/L (ref 1.15–1.40)
Chloride: 103 mmol/L (ref 98–111)
Creatinine, Ser: 1.2 mg/dL (ref 0.61–1.24)
Glucose, Bld: 93 mg/dL (ref 70–99)
HCT: 45 % (ref 39.0–52.0)
Hemoglobin: 15.3 g/dL (ref 13.0–17.0)
Potassium: 3.7 mmol/L (ref 3.5–5.1)
Sodium: 140 mmol/L (ref 135–145)
TCO2: 26 mmol/L (ref 22–32)

## 2022-10-15 LAB — CBG MONITORING, ED
Glucose-Capillary: 113 mg/dL — ABNORMAL HIGH (ref 70–99)
Glucose-Capillary: 193 mg/dL — ABNORMAL HIGH (ref 70–99)

## 2022-10-15 LAB — CREATININE, SERUM
Creatinine, Ser: 1.12 mg/dL (ref 0.61–1.24)
GFR, Estimated: >60 mL/min (ref >60)

## 2022-10-15 MED ORDER — ONDANSETRON HCL 4 MG/2ML IJ SOLN
4.0000 mg | Freq: Once | INTRAMUSCULAR | Status: AC
  Administered 2022-10-15: 4 mg via INTRAVENOUS
  Filled 2022-10-15: qty 2

## 2022-10-15 MED ORDER — CYCLOBENZAPRINE HCL 10 MG PO TABS
10.0000 mg | ORAL_TABLET | Freq: Once | ORAL | Status: AC
  Administered 2022-10-15: 10 mg via ORAL
  Filled 2022-10-15: qty 1

## 2022-10-15 MED ORDER — INSULIN ASPART 100 UNIT/ML IJ SOLN
0.0000 [IU] | Freq: Three times a day (TID) | INTRAMUSCULAR | Status: DC
  Administered 2022-10-16: 3 [IU] via SUBCUTANEOUS

## 2022-10-15 MED ORDER — PROCHLORPERAZINE EDISYLATE 10 MG/2ML IJ SOLN: 10.0000 mg | INTRAMUSCULAR | Status: DC | PRN

## 2022-10-15 MED ORDER — INSULIN ASPART 100 UNIT/ML IJ SOLN: 0.0000 [IU] | Freq: Every day | INTRAMUSCULAR | Status: DC

## 2022-10-15 MED ORDER — ENOXAPARIN SODIUM 30 MG/0.3ML IJ SOSY
30.0000 mg | PREFILLED_SYRINGE | Freq: Two times a day (BID) | INTRAMUSCULAR | Status: DC
  Filled 2022-10-15: qty 0.3

## 2022-10-15 MED ORDER — HYDROMORPHONE HCL 1 MG/ML IJ SOLN
1.0000 mg | INTRAMUSCULAR | Status: DC | PRN
  Administered 2022-10-15: 1 mg via INTRAVENOUS
  Filled 2022-10-15: qty 1

## 2022-10-15 MED ORDER — METHOCARBAMOL 1000 MG/10ML IJ SOLN: 500.0000 mg | Freq: Four times a day (QID) | INTRAVENOUS | Status: DC | PRN

## 2022-10-15 MED ORDER — ACETAMINOPHEN 325 MG PO TABS
650.0000 mg | ORAL_TABLET | Freq: Four times a day (QID) | ORAL | Status: DC
  Administered 2022-10-15 – 2022-10-16 (×2): 650 mg via ORAL
  Filled 2022-10-15 (×2): qty 2

## 2022-10-15 MED ORDER — DOCUSATE SODIUM 100 MG PO CAPS
100.0000 mg | ORAL_CAPSULE | Freq: Two times a day (BID) | ORAL | Status: DC
  Administered 2022-10-15 – 2022-10-16 (×2): 100 mg via ORAL
  Filled 2022-10-15 (×2): qty 1

## 2022-10-15 MED ORDER — ALLOPURINOL 100 MG PO TABS
100.0000 mg | ORAL_TABLET | Freq: Every day | ORAL | Status: DC
  Administered 2022-10-16: 100 mg via ORAL
  Filled 2022-10-15: qty 1

## 2022-10-15 MED ORDER — GABAPENTIN 300 MG PO CAPS
300.0000 mg | ORAL_CAPSULE | Freq: Three times a day (TID) | ORAL | Status: DC
  Administered 2022-10-15 – 2022-10-16 (×2): 300 mg via ORAL
  Filled 2022-10-15 (×2): qty 1

## 2022-10-15 MED ORDER — IOHEXOL 350 MG/ML SOLN
75.0000 mL | Freq: Once | INTRAVENOUS | Status: AC | PRN
  Administered 2022-10-15: 75 mL via INTRAVENOUS

## 2022-10-15 MED ORDER — SODIUM CHLORIDE 0.9 % IV BOLUS
500.0000 mL | Freq: Once | INTRAVENOUS | Status: AC
  Administered 2022-10-15: 500 mL via INTRAVENOUS

## 2022-10-15 MED ORDER — OXYCODONE HCL 5 MG PO TABS: 5.0000 mg | ORAL_TABLET | ORAL | Status: DC | PRN

## 2022-10-15 MED ORDER — MORPHINE SULFATE (PF) 4 MG/ML IV SOLN: 4.0000 mg | INTRAVENOUS | Status: DC | PRN

## 2022-10-15 MED ORDER — ROSUVASTATIN CALCIUM 20 MG PO TABS
20.0000 mg | ORAL_TABLET | Freq: Every day | ORAL | Status: DC
  Administered 2022-10-15 – 2022-10-16 (×2): 20 mg via ORAL
  Filled 2022-10-15 (×2): qty 1

## 2022-10-15 MED ORDER — OXYCODONE HCL 5 MG PO TABS
10.0000 mg | ORAL_TABLET | ORAL | Status: DC | PRN
  Administered 2022-10-16: 10 mg via ORAL
  Filled 2022-10-15: qty 2

## 2022-10-15 MED ORDER — ONDANSETRON HCL 4 MG/2ML IJ SOLN: 4.0000 mg | Freq: Four times a day (QID) | INTRAMUSCULAR | Status: DC | PRN

## 2022-10-15 MED ORDER — SIMETHICONE 80 MG PO CHEW: 80.0000 mg | CHEWABLE_TABLET | Freq: Four times a day (QID) | ORAL | Status: DC | PRN

## 2022-10-15 NOTE — ED Notes (Signed)
Pt and wife opted to leave via POV d/t EMS having not yet arrived. They plan to go to [facility] ED. Pt and wife verbalize understand necessity of seeking ED evaluation. Pt ambulatory out of UC.

## 2022-10-15 NOTE — H&P (Signed)
Admitting Physician: Hyman Hopes Al Bracewell  Service: Trauma Surgery  CC: Fall  Subjective   Mechanism of Injury: Jonathan Vaughn is an 47 y.o. male who presented to the trauma service after a fall off a ladder installing gutters.  He fell onto the crossbar landing on his left flank.  Past Medical History:  Diagnosis Date   Diabetes mellitus without complication    False positive QuantiFERON-TB Gold test    GERD 03/21/2010   Qualifier: Diagnosis of  By: Nelson-Smith CMA (AAMA), Dottie     Gout    Headache(784.0) 01/10/2009   Qualifier: Diagnosis of  By: Gabriel Rung LPN, Nancy     Incarcerated incisional hernia 11/22/2018   LATERAL EPICONDYLITIS OF ELBOW 08/03/2009   Qualifier: Diagnosis of  By: Caryl Never MD, Hart [place] PAIN 01/23/2010   Qualifier: Diagnosis of  By: Gabriel Rung LPN, Harriett Sine      Past Surgical History:  Procedure Laterality Date   CHOLECYSTECTOMY N/A 09/03/2016   Procedure: LAPAROSCOPIC CHOLECYSTECTOMY;  Surgeon: Emelia Loron, MD;  Location: [facility];  Service: General;  Laterality: N/A;   INSERTION OF MESH N/A 11/22/2018   Procedure: Insertion Of Mesh;  Surgeon: Claud Kelp, MD;  Location: [facility] OR;  Service: General;  Laterality: N/A;   TONSILLECTOMY     VENTRAL HERNIA REPAIR N/A 11/22/2018   Procedure: LAPAROSCOPIC REPAIR VENTRAL HERNIA WITH MESH;  Surgeon: Claud Kelp, MD;  Location: [facility] OR;  Service: General;  Laterality: N/A;    Family History  Problem Relation Age of Onset   Crohn's disease Other    Gout Father     Social:  reports that he quit smoking about 6 years ago. His smoking use included cigarettes. He has a 45.00 pack-year smoking history. He has never used smokeless tobacco. He reports that he does not currently use alcohol. He reports that he does not use drugs.  Allergies:  Allergies  Allergen Reactions   Lisinopril Anaphylaxis   Reglan [Metoclopramide] Other (See Comments)    Tick, twitching, jittery    Azithromycin Rash     Medications: Current Outpatient Medications  Medication Instructions   allopurinol (ZYLOPRIM) 100 mg, Oral, Daily   Continuous Blood Gluc Sensor (FREESTYLE LIBRE 3 SENSOR) MISC 1 Device, Does not apply, Every 14 days, Place 1 sensor on the skin every 14 days. Use to check glucose continuously   Dulaglutide (TRULICITY) 0.75 MG/0.5ML SOPN INJECT 0.75 MG SUBCUTANEOUSLY ONE TIME PER WEEK   metFORMIN (GLUCOPHAGE) 1,000 mg, Oral, 2 times daily with meals   rosuvastatin (CRESTOR) 20 mg, Oral, Daily    Objective   Primary Survey: Blood pressure (!) 117/55, pulse 71, temperature 98 F (36.7 C), temperature source Oral, resp. rate (!) 22, height  (1.88 m), weight 121.1 kg, SpO2 95 %. Airway: Patent, protecting airway Breathing: Bilateral breath sounds, breathing spontaneously Circulation: Stable, Palpable peripheral pulses Disability: Moving all extremities,   GCS Eyes: 4 - Eyes open spontaneously  GCS Verbal: 5 - Oriented  GCS Motor: 6 - Obeys commands for movement  GCS 15  Environment/Exposure: Warm, dry  Secondary Survey: Head: Normocephalic, atraumatic Neck: Full range of motion without pain, no midline tenderness Chest:  Left chest wall pain and bruising Abdomen: Soft, non-tender, non-distended Upper Extremities: Strength and sensation intact, palpable peripheral pulses Lower extremities: Strength and sensation intact, palpable peripheral pulses Back: No step offs or deformities, atraumatic Rectal:  deferred Psych: Normal mood and affect    Results for orders placed or performed during the hospital  encounter of 10/15/22 (from the past 24 hour(s))  CBC with Differential     Status: Abnormal   Collection Time: 10/15/22  8:02 PM  Result Value Ref Range   WBC 10.9 (H) 4.0 - 10.5 K/uL   RBC 4.97 4.22 - 5.81 MIL/uL   Hemoglobin 14.5 13.0 - 17.0 g/dL   HCT 40.9 81.1 - 91.4 %   MCV 88.7 80.0 - 100.0 fL   MCH 29.2 26.0 - 34.0 pg   MCHC 32.9 30.0 - 36.0 g/dL   RDW 78.2  95.6 - 21.3 %   Platelets 281 150 - 400 K/uL   nRBC 0.0 0.0 - 0.2 %   Neutrophils Relative % 77 %   Neutro Abs 8.5 (H) 1.7 - 7.7 K/uL   Lymphocytes Relative 15 %   Lymphs Abs 1.6 0.7 - 4.0 K/uL   Monocytes Relative 6 %   Monocytes Absolute 0.7 0.1 - 1.0 K/uL   Eosinophils Relative 1 %   Eosinophils Absolute 0.1 0.0 - 0.5 K/uL   Basophils Relative 1 %   Basophils Absolute 0.1 0.0 - 0.1 K/uL   Immature Granulocytes 0 %   Abs Immature Granulocytes 0.04 0.00 - 0.07 K/uL  I-stat chem 8, ED (not at [facility], [facility] or [facility])     Status: None   Collection Time: 10/15/22  8:14 PM  Result Value Ref Range   Sodium 140 135 - 145 mmol/L   Potassium 3.7 3.5 - 5.1 mmol/L   Chloride 103 98 - 111 mmol/L   BUN 17 6 - 20 mg/dL   Creatinine, Ser 0.86 0.61 - 1.24 mg/dL   Glucose, Bld 93 70 - 99 mg/dL   Calcium, Ion 5.78 4.69 - 1.40 mmol/L   TCO2 26 22 - 32 mmol/L   Hemoglobin 15.3 13.0 - 17.0 g/dL   HCT 62.9 52.8 - 41.3 %     Imaging Orders         CT CHEST ABDOMEN PELVIS W CONTRAST      Assessment and Plan   Jonathan Vaughn is an 47 y.o. male who presented to the trauma service after falling from a ladder.  Injuries: Left pneumothorax (5% in volume) - Admit to hospital for monitoring, pulse ox, cxr in AM Minimally displaced fractures of the lateral left seventh and eighth ribs - pain control, pulmonary toilet  Consults:  None  FEN - Reg VTE - Lovenox, SCDs ID - None  Dispo - Med-Surg Floor    Quentin Ore, MD  [facility] Use [email or web address] to contact on call provider  New Patient Billing: [id number] - High MDM

## 2022-10-15 NOTE — ED Provider Notes (Signed)
[place] EMERGENCY DEPARTMENT AT [facility] Provider Note   CSN: [id number] Arrival date & time: 10/15/22  1826     History  Chief Complaint  Patient presents with   Fall    Jonathan Vaughn is a 47 y.o. male.  Patient is a 47 year old male with a history of hypertension, hyperlipidemia and diabetes who is presenting today after falling off a ladder.  Around 10:00 this morning patient was on the second to the top rung of an 8 foot ladder putting gutters and when the ladder was off balance and he fell over landing directly on his left rib and upper abdomen area on the wood cross of the ladder.  He laid on the ground for at least 10 minutes as he was by himself until he was even able to try to get up.  Since that time he has had severe pain in his left ribs that takes his breath away and makes it almost impossible for him to move.  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  He reports as long as he is lying still and not moving he is okay but anytime he tries to move the pain is unbearable.  He did not hit his head or pass out during this accident today.  He takes no anticoagulation.  He was able to ambulate and denies any significant pain to the legs except for a bruise on his left hip.  His shoulders and arms are normal.  No midline back tenderness.  No neck pain.  The history is provided by the patient.  Fall       Home Medications Prior to Admission medications   Medication Sig Start Date End Date Taking? Authorizing Provider  allopurinol (ZYLOPRIM) 100 MG tablet Take 100 mg by mouth daily.    [provider]  Continuous Blood Gluc Sensor (FREESTYLE LIBRE 3 SENSOR) MISC 1 Device by Does not apply route every 14 (fourteen) days. Place 1 sensor on the skin every 14 days. Use to  check glucose continuously 07/11/22   Nafziger, Kandee Keen, NP  Dulaglutide (TRULICITY) 0.75 MG/0.5ML SOPN INJECT 0.75 MG SUBCUTANEOUSLY ONE TIME PER WEEK 10/07/22   Nafziger, Kandee Keen, NP  metFORMIN (GLUCOPHAGE) 500 MG tablet Take 2 tablets (1,000 mg total) by mouth 2 (two) times daily with a meal. 08/01/22 10/30/22  Nafziger, Kandee Keen, NP  rosuvastatin (CRESTOR) 20 MG tablet Take 1 tablet (20 mg total) by mouth daily. 07/11/22   Nafziger, Kandee Keen, NP      Allergies    Lisinopril, Reglan [metoclopramide], and Azithromycin    Review of Systems   Review of Systems  Physical Exam Updated Vital Signs BP (!) 117/55   Pulse 71   Temp 98 F (36.7 C) (Oral)   Resp (!) 22   Ht  (1.88 m)   Wt 121.1 kg   SpO2 95%   BMI 34.28 kg/m  Physical Exam Vitals and nursing note reviewed.  Constitutional:      General: He is not in acute distress.    Appearance: He is well-developed.  HENT:     Head: Normocephalic and atraumatic.  Eyes:     Conjunctiva/sclera: Conjunctivae normal.     Pupils: Pupils are equal, round, and reactive to light.  Cardiovascular:  Rate and Rhythm: Normal rate and regular rhythm.     Pulses: Normal pulses.     Heart sounds: No murmur heard. Pulmonary:     Effort: Pulmonary effort is normal. No respiratory distress.     Breath sounds: Normal breath sounds. No wheezing or rales.     Comments: No crepitus palpated Chest:     Chest wall: Tenderness present.    Abdominal:     General: There is no distension.     Palpations: Abdomen is soft.     Tenderness: There is no abdominal tenderness. There is left CVA tenderness. There is no guarding or rebound.  Musculoskeletal:        General: No tenderness. Normal range of motion.     Cervical back: Normal, normal range of motion and neck supple.     Thoracic back: Normal.     Lumbar back: Normal.     Right hip: Normal.     Left hip: Normal.       Legs:  Skin:    General: Skin is warm and dry.     Findings: No erythema or rash.   Neurological:     Mental Status: He is alert and oriented to person, place, and time. Mental status is at baseline.  Psychiatric:        Mood and Affect: Mood normal.        Behavior: Behavior normal.     ED Results / Procedures / Treatments   Labs (all labs ordered are listed, but only abnormal results are displayed) Labs Reviewed  CBC WITH DIFFERENTIAL/PLATELET - Abnormal; Notable for the following components:      Result Value   WBC 10.9 (*)    Neutro Abs 8.5 (*)    All other components within normal limits  I-STAT CHEM 8, ED    EKG None  Radiology CT CHEST ABDOMEN PELVIS W CONTRAST  Result Date: 10/15/2022 CLINICAL DATA:  Trauma, fall from ladder EXAM: CT CHEST, ABDOMEN, AND PELVIS WITH CONTRAST TECHNIQUE: Multidetector CT imaging of the chest, abdomen and pelvis was performed following the standard protocol during bolus administration of intravenous contrast. RADIATION DOSE REDUCTION: This exam was performed according to the departmental dose-optimization program which includes automated exposure control, adjustment of the mA and/or kV according to patient size and/or use of iterative reconstruction technique. CONTRAST:  75mL OMNIPAQUE IOHEXOL 350 MG/ML SOLN COMPARISON:  CT abdomen pelvis, 09/02/2016 FINDINGS: CT CHEST FINDINGS Cardiovascular: No significant vascular findings. Normal heart size. No pericardial effusion. Mediastinum/Nodes: No enlarged mediastinal, hilar, or axillary lymph nodes. Thyroid gland, trachea, and esophagus demonstrate no significant findings. Lungs/Pleura: Small left pneumothorax, 5% in volume (series 5, image 106). Mild dependent bibasilar scarring or atelectasis. Musculoskeletal: No chest wall abnormality. Minimally displaced fractures of the lateral left seventh and eighth ribs (series 5, image 93). CT ABDOMEN PELVIS FINDINGS Hepatobiliary: No focal liver abnormality is seen. Hepatomegaly, maximum coronal span 21.6 cm. Hepatic steatosis. Status post  cholecystectomy. No biliary dilatation. Pancreas: Unremarkable. No pancreatic ductal dilatation or surrounding inflammatory changes. Spleen: Normal in size without significant abnormality. Adrenals/Urinary Tract: Adrenal glands are unremarkable. Kidneys are normal, without renal calculi, solid lesion, or hydronephrosis. Bladder is unremarkable. Stomach/Bowel: Stomach is within normal limits. Appendix appears normal. No evidence of bowel wall thickening, distention, or inflammatory changes. Occasional descending and sigmoid diverticulosis. Vascular/Lymphatic: Aortic atherosclerosis. No enlarged abdominal or pelvic lymph nodes. Reproductive: No mass or other abnormality. Other: No abdominal wall hernia or abnormality. No ascites. Musculoskeletal: No acute osseous findings.  Soft tissue stranding over the left flank (series 3, image 100) IMPRESSION: 1. Small left pneumothorax, 5% in volume. 2. Minimally displaced fractures of the lateral left seventh and eighth ribs. 3. No CT evidence of acute traumatic injury to the organs of the abdomen or pelvis. 4. Superficial soft tissue contusion of the left flank. 5. Hepatomegaly and hepatic steatosis. 6. Status post cholecystectomy. These results were called by telephone at the time of interpretation on 10/15/2022 at 9:01 pm to Dr. Gwyneth Sprout , who verbally acknowledged these results. Aortic Atherosclerosis (ICD10-I70.0). Electronically Signed   By: Jearld Lesch M.D.   On: 10/15/2022 21:08    Procedures Procedures    Medications Ordered in ED Medications  cyclobenzaprine (FLEXERIL) tablet 10 mg (10 mg Oral Given 10/15/22 2006)  iohexol (OMNIPAQUE) 350 MG/ML injection 75 mL (75 mLs Intravenous Contrast Given 10/15/22 2046)    ED Course/ Medical Decision Making/ A&P                             Medical Decision Making Amount and/or Complexity of Data Reviewed Labs: ordered. Decision-making details documented in ED Course. Radiology: ordered and independent  interpretation performed. Decision-making details documented in ED Course.  Risk Prescription drug management. Decision regarding hospitalization.   Pt with multiple medical problems and comorbidities and presenting today with a complaint that caries a high risk for morbidity and mortality.  Here today after falling over 8 feet off a ladder and landing directly on one of the crossbars on his left ribs.  Concern for rib fracture versus pulmonary contusion versus renal hematoma.  Lower suspicion for splenic injury.  Patient was awake the entire time in denies any head injury or loss of consciousness.  He has no signs of trauma to his head.  He did receive fentanyl prior to my evaluation reports that did not significantly help with the pain and he did not like the way it made him feel.  While lying still he is fairly comfortable but any movement causes severe pain.  Will do a CT of the chest and abdomen to ensure no underlying injury.  9:18 PM I have independently visualized and interpreted pt's images today.  CT today showing multiple left-sided rib fractures and a small pneumothorax.  No evidence of renal or splenic injury.  Patient is 95% on room air was placed on nasal cannula.  Will consult trauma surgery for ongoing care and admission.          Final Clinical Impression(s) / ED Diagnoses Final diagnoses:  Fall, initial encounter  Closed fracture of multiple ribs of left side, initial encounter  Traumatic pneumothorax, initial encounter    Rx / DC Orders ED Discharge Orders     None         Gwyneth Sprout, MD 10/15/22 2118

## 2022-10-15 NOTE — ED Triage Notes (Signed)
Pt presents via POV s/p fall from 8-ft ladder on to his left side, landing on the ladder. Denies any LOC or hitting his head. Pt c/o severe pain in LLQ/ left lower lung field. Pain increased with inspiration. States something "keeps popping." After assessment and speaking with NP, pt was advised to go to ED. Pt states he cannot sit up and cannot get back in the car. EMS was called for pt.

## 2022-10-15 NOTE — ED Notes (Signed)
Pt returned from CT °

## 2022-10-15 NOTE — ED Notes (Signed)
ED TO INPATIENT HANDOFF REPORT  ED Nurse Name and Phone #: Alycia Rossetti [phone]   S Name/Age/Gender Jonathan Vaughn 47 y.o. male Room/Bed: 003C/003C  Code Status   Code Status: Full Code  Home/SNF/Other Home Patient oriented to: self, place, time, and situation Is this baseline? Yes   Triage Complete: Triage complete  Chief Complaint Pneumothorax on left [J93.9]  Triage Note [facility]. Patient fell from a ladder around 10am this morning. Landed on the ladder on his left side on the ground. Approximately 60ft fall. Lung sounds clear. Given fentanyl IM.    Allergies Allergies  Allergen Reactions   Lisinopril Anaphylaxis   Reglan [Metoclopramide] Other (See Comments)    Tick, twitching, jittery    Azithromycin Rash    Level of Care/Admitting Diagnosis ED Disposition     ED Disposition  Admit   Condition  --   Comment  Hospital Area: [facility] [[id number]]  Level of Care: Med-Surg [16]  May admit patient to [facility] or [facility] if equivalent level of care is available:: No  Covid Evaluation: Asymptomatic - no recent exposure (last 10 days) testing not required  Diagnosis: Pneumothorax on left [454098]  Admitting Physician: TRAUMA MD [[id number]]  Attending Physician: TRAUMA MD [[id number]]  Certification:: I certify this patient will need inpatient services for at least 2 midnights  Estimated Length of Stay: 2          B Medical/Surgery History Past Medical History:  Diagnosis Date   Diabetes mellitus without complication    False positive QuantiFERON-TB Gold test    GERD 03/21/2010   Qualifier: Diagnosis of  By: Nelson-Smith CMA (AAMA), Dottie     Gout    Headache(784.0) 01/10/2009   Qualifier: Diagnosis of  By: Gabriel Rung LPN, Nancy     Incarcerated incisional hernia 11/22/2018   LATERAL EPICONDYLITIS OF ELBOW 08/03/2009   Qualifier: Diagnosis of  By: Caryl Never MD, Hart [place] PAIN 01/23/2010    Qualifier: Diagnosis of  By: Gabriel Rung LPN, Harriett Sine     Past Surgical History:  Procedure Laterality Date   CHOLECYSTECTOMY N/A 09/03/2016   Procedure: LAPAROSCOPIC CHOLECYSTECTOMY;  Surgeon: Emelia Loron, MD;  Location: [facility] OR;  Service: General;  Laterality: N/A;   INSERTION OF MESH N/A 11/22/2018   Procedure: Insertion Of Mesh;  Surgeon: Claud Kelp, MD;  Location: [facility] OR;  Service: General;  Laterality: N/A;   TONSILLECTOMY     VENTRAL HERNIA REPAIR N/A 11/22/2018   Procedure: LAPAROSCOPIC REPAIR VENTRAL HERNIA WITH MESH;  Surgeon: Claud Kelp, MD;  Location: [facility] OR;  Service: General;  Laterality: N/A;     A IV Location/Drains/Wounds Patient Lines/Drains/Airways Status     Active Line/Drains/Airways     Name Placement date Placement time Site Days   Peripheral IV 10/15/22 20 G Left Antecubital 10/15/22  2009  Antecubital  less than 1   Incision - 4 Ports Abdomen 1: Umbilicus 2: Mid;Upper 3: Right;Medial 4: Right;Lateral 09/03/16  --  -- 2233   Incision - 4 Ports Abdomen 1: Left;Upper 2: Left;Mid 3: Left;Lower 4: Right 11/22/18  0905  -- 1423   Incision - 1 Port Abdomen 1: Umbilicus;Lateral;Left 09/03/16  1145  -- 2233            Intake/Output Last 24 hours No intake or output data in the 24 hours ending 10/15/22 2237  Labs/Imaging Results for orders placed or performed during the hospital encounter of 10/15/22 (from the  past 48 hour(s))  CBC with Differential     Status: Abnormal   Collection Time: 10/15/22  8:02 PM  Result Value Ref Range   WBC 10.9 (H) 4.0 - 10.5 K/uL   RBC 4.97 4.22 - 5.81 MIL/uL   Hemoglobin 14.5 13.0 - 17.0 g/dL   HCT 16.1 09.6 - 04.5 %   MCV 88.7 80.0 - 100.0 fL   MCH 29.2 26.0 - 34.0 pg   MCHC 32.9 30.0 - 36.0 g/dL   RDW 40.9 81.1 - 91.4 %   Platelets 281 150 - 400 K/uL   nRBC 0.0 0.0 - 0.2 %   Neutrophils Relative % 77 %   Neutro Abs 8.5 (H) 1.7 - 7.7 K/uL   Lymphocytes Relative 15 %   Lymphs Abs 1.6 0.7 - 4.0 K/uL   Monocytes  Relative 6 %   Monocytes Absolute 0.7 0.1 - 1.0 K/uL   Eosinophils Relative 1 %   Eosinophils Absolute 0.1 0.0 - 0.5 K/uL   Basophils Relative 1 %   Basophils Absolute 0.1 0.0 - 0.1 K/uL   Immature Granulocytes 0 %   Abs Immature Granulocytes 0.04 0.00 - 0.07 K/uL    Comment: Performed at [facility], [address], [place], [place] [address]  I-stat chem 8, ED (not at [facility], [facility] or [facility])     Status: None   Collection Time: 10/15/22  8:14 PM  Result Value Ref Range   Sodium 140 135 - 145 mmol/L   Potassium 3.7 3.5 - 5.1 mmol/L   Chloride 103 98 - 111 mmol/L   BUN 17 6 - 20 mg/dL   Creatinine, Ser 6.21 0.61 - 1.24 mg/dL   Glucose, Bld 93 70 - 99 mg/dL    Comment: Glucose reference range applies only to samples taken after fasting for at least 8 hours.   Calcium, Ion 1.16 1.15 - 1.40 mmol/L   TCO2 26 22 - 32 mmol/L   Hemoglobin 15.3 13.0 - 17.0 g/dL   HCT 30.8 65.7 - 84.6 %  CBG monitoring, ED     Status: Abnormal   Collection Time: 10/15/22  9:53 PM  Result Value Ref Range   Glucose-Capillary 113 (H) 70 - 99 mg/dL    Comment: Glucose reference range applies only to samples taken after fasting for at least 8 hours.   CT CHEST ABDOMEN PELVIS W CONTRAST  Result Date: 10/15/2022 CLINICAL DATA:  Trauma, fall from ladder EXAM: CT CHEST, ABDOMEN, AND PELVIS WITH CONTRAST TECHNIQUE: Multidetector CT imaging of the chest, abdomen and pelvis was performed following the standard protocol during bolus administration of intravenous contrast. RADIATION DOSE REDUCTION: This exam was performed according to the departmental dose-optimization program which includes automated exposure control, adjustment of the mA and/or kV according to patient size and/or use of iterative reconstruction technique. CONTRAST:  75mL OMNIPAQUE IOHEXOL 350 MG/ML SOLN COMPARISON:  CT abdomen pelvis, 09/02/2016 FINDINGS: CT CHEST FINDINGS Cardiovascular: No significant vascular findings. Normal heart size. No pericardial  effusion. Mediastinum/Nodes: No enlarged mediastinal, hilar, or axillary lymph nodes. Thyroid gland, trachea, and esophagus demonstrate no significant findings. Lungs/Pleura: Small left pneumothorax, 5% in volume (series 5, image 106). Mild dependent bibasilar scarring or atelectasis. Musculoskeletal: No chest wall abnormality. Minimally displaced fractures of the lateral left seventh and eighth ribs (series 5, image 93). CT ABDOMEN PELVIS FINDINGS Hepatobiliary: No focal liver abnormality is seen. Hepatomegaly, maximum coronal span 21.6 cm. Hepatic steatosis. Status post cholecystectomy. No biliary dilatation. Pancreas: Unremarkable. No pancreatic ductal dilatation  or surrounding inflammatory changes. Spleen: Normal in size without significant abnormality. Adrenals/Urinary Tract: Adrenal glands are unremarkable. Kidneys are normal, without renal calculi, solid lesion, or hydronephrosis. Bladder is unremarkable. Stomach/Bowel: Stomach is within normal limits. Appendix appears normal. No evidence of bowel wall thickening, distention, or inflammatory changes. Occasional descending and sigmoid diverticulosis. Vascular/Lymphatic: Aortic atherosclerosis. No enlarged abdominal or pelvic lymph nodes. Reproductive: No mass or other abnormality. Other: No abdominal wall hernia or abnormality. No ascites. Musculoskeletal: No acute osseous findings. Soft tissue stranding over the left flank (series 3, image 100) IMPRESSION: 1. Small left pneumothorax, 5% in volume. 2. Minimally displaced fractures of the lateral left seventh and eighth ribs. 3. No CT evidence of acute traumatic injury to the organs of the abdomen or pelvis. 4. Superficial soft tissue contusion of the left flank. 5. Hepatomegaly and hepatic steatosis. 6. Status post cholecystectomy. These results were called by telephone at the time of interpretation on 10/15/2022 at 9:01 pm to Dr. Gwyneth Sprout , who verbally acknowledged these results. Aortic  Atherosclerosis (ICD10-I70.0). Electronically Signed   By: Jearld Lesch M.D.   On: 10/15/2022 21:08    Pending Labs Unresulted Labs (From admission, onward)     Start     Ordered   10/22/22 0500  Creatinine, serum  (enoxaparin (LOVENOX)    CrCl >/= 30 with major trauma, spinal cord injury, or selected orthopedic surgery)  Weekly,   R     Comments: while on enoxaparin therapy.    10/15/22 2130   10/15/22 2128  HIV Antibody (routine testing w rflx)  (HIV Antibody (Routine testing w reflex) panel)  Once,   R        10/15/22 2130   10/15/22 2128  CBC  (enoxaparin (LOVENOX)    CrCl >/= 30 with major trauma, spinal cord injury, or selected orthopedic surgery)  Once,   R       Comments: Baseline for enoxaparin therapy IF NOT already drawn.  Notify MD if PLT < 100 K.    10/15/22 2130   10/15/22 2128  Creatinine, serum  (enoxaparin (LOVENOX)    CrCl >/= 30 with major trauma, spinal cord injury, or selected orthopedic surgery)  Once,   R       Comments: Baseline for enoxaparin therapy IF NOT already drawn.    10/15/22 2130            Vitals/Pain Today's Vitals   10/15/22 2130 10/15/22 2145 10/15/22 2200 10/15/22 2215  BP: (!) 137/52 (!) 147/92 121/76 (!) 129/92  Pulse:      Resp:      Temp:      TempSrc:      SpO2:      Weight:      Height:      PainSc:        Isolation Precautions No active isolations  Medications Medications  morphine (PF) 4 MG/ML injection 4 mg (has no administration in time range)  enoxaparin (LOVENOX) injection 30 mg (has no administration in time range)  acetaminophen (TYLENOL) tablet 650 mg (650 mg Oral Given 10/15/22 2144)  gabapentin (NEURONTIN) capsule 300 mg (300 mg Oral Given 10/15/22 2144)  oxyCODONE (Oxy IR/ROXICODONE) immediate release tablet 5 mg (has no administration in time range)  oxyCODONE (Oxy IR/ROXICODONE) immediate release tablet 10 mg (has no administration in time range)  HYDROmorphone (DILAUDID) injection 1 mg (1 mg Intravenous  Given 10/15/22 2144)  methocarbamol (ROBAXIN) 500 mg in dextrose 5 % 50 mL IVPB (has no administration  in time range)  ondansetron (ZOFRAN) injection 4 mg (has no administration in time range)  prochlorperazine (COMPAZINE) injection 10 mg (has no administration in time range)  simethicone (MYLICON) chewable tablet 80 mg (has no administration in time range)  docusate sodium (COLACE) capsule 100 mg (100 mg Oral Given 10/15/22 2144)  allopurinol (ZYLOPRIM) tablet 100 mg (has no administration in time range)  rosuvastatin (CRESTOR) tablet 20 mg (20 mg Oral Given 10/15/22 2144)  insulin aspart (novoLOG) injection 0-15 Units (has no administration in time range)  insulin aspart (novoLOG) injection 0-5 Units ( Subcutaneous Not Given 10/15/22 2157)  cyclobenzaprine (FLEXERIL) tablet 10 mg (10 mg Oral Given 10/15/22 2006)  iohexol (OMNIPAQUE) 350 MG/ML injection 75 mL (75 mLs Intravenous Contrast Given 10/15/22 2046)  sodium chloride 0.9 % bolus 500 mL (500 mLs Intravenous New Bag/Given 10/15/22 2145)  ondansetron (ZOFRAN) injection 4 mg (4 mg Intravenous Given 10/15/22 2144)    Mobility walks     Focused Assessments Cardiac Assessment Handoff:    Lab Results  Component Value Date   TROPONINI <0.30 01/14/2012   Lab Results  Component Value Date   DDIMER <0.27 12/19/2013   Does the Patient currently have chest pain? No    R Recommendations: See Admitting Provider Note  Report given to:   Additional Notes: Super nice guy, fell off ladder at work. Wants to get better to go fishing.

## 2022-10-15 NOTE — ED Triage Notes (Signed)
Per [facility] pt coming [facility]. Patient fell from a ladder around 10am this morning. Landed on the ladder on his left side on the ground. Approximately 44ft fall. Lung sounds clear. Given fentanyl IM.

## 2022-10-15 NOTE — ED Notes (Signed)
Pt frustrated at this time; states he is ready to go and no one has told him anything.  Pt updated on current orders and plan of care by Trish Mage.

## 2022-10-15 NOTE — ED Notes (Signed)
Patient transported to CT 

## 2022-10-15 NOTE — ED Notes (Addendum)
Patient is being discharged from the Urgent Care and sent to the Emergency Department via POV . Per Christoper Fabian, NP, patient is in need of higher level of care due to 8-ft fall from ladder. Patient is aware and verbalizes understanding of plan of care.  Vitals:   10/15/22 1657 10/15/22 1712  BP: (!) 149/83   Pulse: 78   Resp: (!) 24   Temp:  98.1 F (36.7 C)  SpO2: 95%

## 2022-10-15 NOTE — ED Notes (Signed)
EDP at bedside  

## 2022-10-16 ENCOUNTER — Inpatient Hospital Stay (HOSPITAL_COMMUNITY): Payer: 59

## 2022-10-16 ENCOUNTER — Other Ambulatory Visit (HOSPITAL_COMMUNITY): Payer: Self-pay

## 2022-10-16 DIAGNOSIS — S270XXA Traumatic pneumothorax, initial encounter: Secondary | ICD-10-CM | POA: Diagnosis not present

## 2022-10-16 DIAGNOSIS — S2242XA Multiple fractures of ribs, left side, initial encounter for closed fracture: Secondary | ICD-10-CM | POA: Diagnosis not present

## 2022-10-16 LAB — HIV ANTIBODY (ROUTINE TESTING W REFLEX): HIV Screen 4th Generation wRfx: NONREACTIVE

## 2022-10-16 LAB — GLUCOSE, CAPILLARY: Glucose-Capillary: 131 mg/dL — ABNORMAL HIGH (ref 70–99)

## 2022-10-16 MED ORDER — OXYCODONE HCL 5 MG PO TABS
5.0000 mg | ORAL_TABLET | ORAL | 0 refills | Status: DC | PRN
  Filled 2022-10-16: qty 20, 4d supply, fill #0

## 2022-10-16 MED ORDER — IBUPROFEN 600 MG PO TABS
600.0000 mg | ORAL_TABLET | Freq: Three times a day (TID) | ORAL | 0 refills | Status: DC | PRN
  Filled 2022-10-16: qty 20, 7d supply, fill #0

## 2022-10-16 MED ORDER — METHOCARBAMOL 500 MG PO TABS
500.0000 mg | ORAL_TABLET | Freq: Three times a day (TID) | ORAL | 0 refills | Status: DC | PRN
  Filled 2022-10-16: qty 50, 17d supply, fill #0

## 2022-10-16 MED ORDER — ACETAMINOPHEN 500 MG PO TABS: 1000.0000 mg | ORAL_TABLET | Freq: Four times a day (QID) | ORAL | Status: AC

## 2022-10-16 NOTE — Progress Notes (Signed)
Patient is confined to one room and is unable to ambulate to the bathroom, therefore needing a commode at the bedside.    Damaria Stofko W. Cadyn Rodger, RN, BSN  Trauma/Neuro ICU Case Manager [phone]  

## 2022-10-16 NOTE — Progress Notes (Signed)
Patient and patients wife said the mother in law had a bedside commode that they are going to borrow. And did not need one at this time.

## 2022-10-16 NOTE — TOC Transition Note (Signed)
Transition of Care [facility]) - CM/SW Discharge Note   Patient Details  Name: Jonathan Vaughn MRN: [id number] Date of Birth: Mar 07, 1976  Transition of Care [facility]) CM/SW Contact:  Glennon Mac, RN Phone Number: 10/16/2022, 10:20am  Clinical Narrative:    47 y.o. male who presented 10/15/22 after a fall off a ladder onto the crossbar landing on his left flank. +Lt pneumothorax, Lt 7-8 rib fxs. PTA, pt independent and living at home with spouse, who can provide needed assistance at dc. PT/OT recommending no OP follow up; [facility] for home.  Referral to [facility] for [facility], to be delivered to bedside prior to dc.      Final next level of care: Home/Self Care Barriers to Discharge: Barriers Resolved                       Discharge Plan and Services Additional resources added to the After Visit Summary for     Discharge Planning Services: CM Consult            DME Arranged: Bedside commode DME Agency: [employer] Date DME Agency Contacted: 10/16/22 Time DME Agency Contacted: 1003 Representative spoke with at DME Agency: Tommye Standard            Social Determinants of Health (SDOH) Interventions SDOH Screenings   Food Insecurity: No Food Insecurity (10/16/2022)  Housing: Low Risk  (10/16/2022)  Transportation Needs: No Transportation Needs (10/16/2022)  Utilities: Not At Risk (10/16/2022)  Depression (PHQ2-9): Low Risk  (07/15/2022)  Recent Concern: Depression (PHQ2-9) - High Risk (07/11/2022)  Tobacco Use: Medium Risk (10/15/2022)     Readmission Risk Interventions     No data to display         Quintella Baton, RN, BSN  Trauma/Neuro ICU Case Manager [phone]

## 2022-10-16 NOTE — Discharge Summary (Signed)
Patient ID: Jonathan T Rainbow [id number] 1976/02/20 47 y.o.  Admit date: 10/15/2022 Discharge date: 10/16/2022  Admitting Diagnosis: Fall from ladder L 7,8 rib fxs L trace PTX  Discharge Diagnosis Patient Active Problem List   Diagnosis Date Noted   Pneumothorax on left 10/15/2022   Pain in right foot 11/20/2020   Mixed hyperlipidemia 10/25/2019   Incarcerated incisional hernia 11/22/2018   Tobacco use disorder 11/26/2016   Gout 11/26/2016   GERD 03/21/2010   GASTRITIS 03/21/2010   SHOULDER PAIN 01/23/2010   LATERAL EPICONDYLITIS OF ELBOW 08/03/2009   HEADACHE 01/10/2009  Fall from ladder L 7,8 rib fxs L trace PTX, resolved  Consultants none  Reason for Admission: Jonathan Vaughn is an 47 y.o. male who presented to the trauma service after a fall off a ladder installing gutters.  He fell onto the crossbar landing on his left flank.   Procedures none  Hospital Course:  The patient was admitted for pain control and follow up CXR.  His follow up CXR showed resolution of his PTX.  His pain was well controlled.  He was mobilized with therapies with no follow up recommended.  Equipment was ordered as requested.    Physical Exam: Gen: sleepy from pain meds, but NAD HEENT: PERRL Heart: regular Lungs: CTAB, chest wall tenderness on left side as expected Abd: soft, NT, ND Ext: MAE with no difficulty Psych: A&Ox3, but sleepy from meds  Allergies as of 10/16/2022       Reactions   Lisinopril Anaphylaxis   Reglan [metoclopramide] Other (See Comments)   Tick, twitching, jittery   Azithromycin Rash        Medication List     STOP taking these medications    allopurinol 100 MG tablet Commonly known as: ZYLOPRIM   rosuvastatin 20 MG tablet Commonly known as: Crestor       TAKE these medications    acetaminophen 500 MG tablet Commonly known as: TYLENOL Take 2 tablets (1,000 mg total) by mouth every 6 (six) hours.   FreeStyle Libre 3 Sensor Misc 1  Device by Does not apply route every 14 (fourteen) days. Place 1 sensor on the skin every 14 days. Use to check glucose continuously   ibuprofen 600 MG tablet Commonly known as: ADVIL Take 1 tablet (600 mg total) by mouth every 8 (eight) hours as needed.   metFORMIN 500 MG tablet Commonly known as: GLUCOPHAGE Take 2 tablets (1,000 mg total) by mouth 2 (two) times daily with a meal. What changed:  how much to take when to take this reasons to take this   methocarbamol 500 MG tablet Commonly known as: ROBAXIN Take 1 tablet (500 mg total) by mouth every 8 (eight) hours as needed for muscle spasms.   oxyCODONE 5 MG immediate release tablet Commonly known as: Oxy IR/ROXICODONE Take 1 tablet (5 mg total) by mouth every 4 (four) hours as needed for moderate pain.   Trulicity 0.75 MG/0.5ML Sopn Generic drug: Dulaglutide INJECT 0.75 MG SUBCUTANEOUSLY ONE TIME PER WEEK What changed: See the new instructions.               Durable Medical Equipment  (From admission, onward)           Start     Ordered   10/16/22 0949  For home use only DME 3 n 1  Once        10/16/22 0948   10/16/22 0949  For home use only DME 3 n 1  Once        10/16/22 1610              Follow-up Information     Nafziger, Kandee Keen, NP Follow up.   Specialty: Family Medicine Why: As needed Contact information: [address] [place] [address] [phone]         [facility] Follow up.   Why: Call if you have questions Contact information: [address] [place] [address] [phone]                Signed: Barnetta Chapel, [facility] 10/16/2022, 10:09 AM Please see Amion for pager number during day hours 7:00am-4:30pm, 7-11:30am on Weekends

## 2022-10-16 NOTE — Evaluation (Signed)
Physical Therapy Evaluation Patient Details Name: Jonathan Vaughn MRN: [id number] DOB: [birth year] Today's Date: 10/16/2022  History of Present Illness  47 y.o. male who presented 10/15/22 after a fall off a ladder onto the crossbar landing on his left flank. +Lt pneumothorax, Lt 7-8 rib fxs  PMH-DM, gout, hernia repair  Clinical Impression   Patient evaluated by Physical Therapy with no further acute PT needs identified. All education has been completed and the patient has no further questions. Patient with greatest difficulty/most pain with seated position. Recommend BSC (can elevate and with armrests). See below for any follow-up Physical Therapy or equipment needs. PT is signing off. Thank you for this referral.        Recommendations for follow up therapy are one component of a multi-disciplinary discharge planning process, led by the attending physician.  Recommendations may be updated based on patient status, additional functional criteria and insurance authorization.  Follow Up Recommendations       Assistance Recommended at Discharge PRN  Patient can return home with the following  A little help with bathing/dressing/bathroom;Assistance with cooking/housework;Assist for transportation    Equipment Recommendations BSC/3in1  Recommendations for Other Services       Functional Status Assessment Patient has had a recent decline in their functional status and demonstrates the ability to make significant improvements in function in a reasonable and predictable amount of time.     Precautions / Restrictions Precautions Precautions: None      Mobility  Bed Mobility Overal bed mobility: Needs Assistance Bed Mobility: Rolling, Sidelying to Sit, Sit to Sidelying Rolling: Min assist Sidelying to sit: Min guard     Sit to sidelying: Min guard General bed mobility comments: vc for technique and min assist to roll due to pain    Transfers Overall transfer level: Needs  assistance Equipment used: None Transfers: Sit to/from Stand Sit to Stand: Min guard           General transfer comment: guarding due to lethargy/drowsy    Ambulation/Gait Ambulation/Gait assistance: Min assist, Independent Gait Distance (Feet): 170 Feet Assistive device: None Gait Pattern/deviations: WFL(Within Functional Limits), Staggering left       General Gait Details: stagger to left x 1 when turning left; progressed to independent with incr distance  Stairs            Wheelchair Mobility    Modified Rankin (Stroke Patients Only)       Balance Overall balance assessment: Independent (once fully awake)                                           Pertinent Vitals/Pain Pain Assessment Pain Assessment: 0-10 Pain Score: 10-Worst pain ever Pain Location: left ribs Pain Descriptors / Indicators: Sharp Pain Intervention(s): Limited activity within patient's tolerance, Monitored during session, Repositioned    Home Living Family/patient expects to be discharged to:: Private residence Living Arrangements: Spouse/significant other Available Help at Discharge: Family;Available 24 hours/day Type of Home: House Home Access: Stairs to enter Entrance Stairs-Rails: Right Entrance Stairs-Number of Steps: 4   Home Layout: Two level;Able to live on main level with bedroom/bathroom Home Equipment: Rolling Walker (2 wheels);Cane - single point;Crutches      Prior Function Prior Level of Function : Independent/Modified Independent;Working/employed;Driving                     Hand Dominance  Dominant Hand: Right    Extremity/Trunk Assessment   Upper Extremity Assessment Upper Extremity Assessment:  (limits overhead reaching with LUE due to pain)    Lower Extremity Assessment Lower Extremity Assessment: Overall WFL for tasks assessed    Cervical / Trunk Assessment Cervical / Trunk Assessment: Other exceptions Cervical / Trunk  Exceptions: limits his trunk mobility due to left flank pain  Communication   Communication: No difficulties  Cognition Arousal/Alertness: Lethargic (became more alert as walking) Behavior During Therapy: WFL for tasks assessed/performed Overall Cognitive Status: Within Functional Limits for tasks assessed                                          General Comments General comments (skin integrity, edema, etc.): sats on RA 96%; on return to room sat monitor got wet and stopped working; replaced and still not registering; RN made aware and ok with pt on room air    Exercises     Assessment/Plan    PT Assessment Patient does not need any further PT services  PT Problem List         PT Treatment Interventions      PT Goals (Current goals can be found in the Care Plan section)  Acute Rehab PT Goals Patient Stated Goal: go home today PT Goal Formulation: All assessment and education complete, DC therapy    Frequency       Co-evaluation               AM-PAC PT "6 Clicks" Mobility  Outcome Measure Help needed turning from your back to your side while in a flat bed without using bedrails?: A Little Help needed moving from lying on your back to sitting on the side of a flat bed without using bedrails?: A Little Help needed moving to and from a bed to a chair (including a wheelchair)?: A Little Help needed standing up from a chair using your arms (e.g., wheelchair or bedside chair)?: A Little Help needed to walk in hospital room?: None Help needed climbing 3-5 steps with a railing? : None 6 Click Score: 20    End of Session   Activity Tolerance: Patient limited by pain (could not tolerate sitting in chair) Patient left: in bed;with call bell/phone within reach;with bed alarm set Nurse Communication: Mobility status;Other (comment) (on room air with sat monitor not registering) PT Visit Diagnosis: Difficulty in walking, not elsewhere classified (R26.2)     Time: 1610-9604 PT Time Calculation (min) (ACUTE ONLY): 34 min   Charges:   PT Evaluation $PT Eval Low Complexity: 1 Low PT Treatments $Gait Training: 8-22 mins         Jerolyn Center, PT Acute Rehabilitation Services  Office [phone]   Zena Amos 10/16/2022, 9:46 AM

## 2022-10-16 NOTE — Evaluation (Addendum)
Occupational Therapy Evaluation and Discharge Patient Details Name: Jonathan Vaughn MRN: [id number] DOB: 02-09-1976 Today's Date: 10/16/2022   History of Present Illness 47 y.o. male who presented 10/15/22 after a fall off a ladder onto the crossbar landing on his left flank. +Lt pneumothorax, Lt 7-8 rib fxs  PMH-DM, gout, hernia repair   Clinical Impression   Pt admitted with the above diagnoses. At baseline, pt is independent with ADLs. Pt limited by "11"/10 L flank pain with movement. Pt recently given pain med; noted to fall asleep quickly at rest. Pt currently needs up to light min A with  bathing/dressing; otherwise setup for ADLs. No further OT needs indicated, OT signing off.      Recommendations for follow up therapy are one component of a multi-disciplinary discharge planning process, led by the attending physician.  Recommendations may be updated based on patient status, additional functional criteria and insurance authorization.   Assistance Recommended at Discharge Set up Supervision/Assistance  Patient can return home with the following Assist for transportation    Functional Status Assessment  Patient has had a recent decline in their functional status and demonstrates the ability to make significant improvements in function in a reasonable and predictable amount of time.  Equipment Recommendations  BSC/3in1    Recommendations for Other Services       Precautions / Restrictions Precautions Precautions: None      Mobility Bed Mobility Overal bed mobility: Needs Assistance Bed Mobility: Rolling, Sidelying to Sit, Sit to Sidelying Rolling: Min assist Sidelying to sit: Min assist     Sit to sidelying: Min guard General bed mobility comments: limited by L flank pain    Transfers Overall transfer level: Needs assistance Equipment used: None Transfers: Sit to/from Stand Sit to Stand: Min guard           General transfer comment: L flank pain and pt drowsy  suspect d/t med      Balance Overall balance assessment: Independent, Modified Independent                                         ADL either performed or assessed with clinical judgement   ADL Overall ADL's : Needs assistance/impaired Eating/Feeding: Set up   Grooming: Set up   Upper Body Bathing: Set up;Sitting   Lower Body Bathing: Minimal assistance;Min guard;Sit to/from stand   Upper Body Dressing : Set up;Sitting;Minimal assistance   Lower Body Dressing: Min guard;Minimal assistance;Sit to/from stand   Toilet Transfer: Supervision/safety;Min guard   Toileting- Architect and Hygiene: Min guard;Supervision/safety   Tub/ Shower Transfer: Supervision/safety;Min guard   Functional mobility during ADLs: Supervision/safety General ADL Comments: Pt limited by L flank pain.     Vision         Perception     Praxis      Pertinent Vitals/Pain Pain Assessment Pain Assessment: 0-10 Pain Score: 5  Pain Location: left ribs at rest; "11"/10 with movement Pain Descriptors / Indicators: Sharp Pain Intervention(s): Monitored during session, Limited activity within patient's tolerance, Premedicated before session, Repositioned     Hand Dominance Right   Extremity/Trunk Assessment Upper Extremity Assessment Upper Extremity Assessment:  (limits overhead reaching with LUE due to pain)   Lower Extremity Assessment Lower Extremity Assessment: Defer to PT evaluation   Cervical / Trunk Assessment Cervical / Trunk Assessment: Other exceptions Cervical / Trunk Exceptions: limits his trunk mobility due  to left flank pain   Communication Communication Communication: No difficulties   Cognition Arousal/Alertness: Awake/alert Behavior During Therapy: WFL for tasks assessed/performed Overall Cognitive Status: Within Functional Limits for tasks assessed                                       General Comments  sats on RA 96%; on  return to room sat monitor got wet and stopped working; replaced and still not registering; RN made aware and ok with pt on room air    Exercises     Shoulder Instructions      Home Living Family/patient expects to be discharged to:: Private residence Living Arrangements: Spouse/significant other Available Help at Discharge: Family;Available 24 hours/day Type of Home: House Home Access: Stairs to enter [employer] of Steps: 4 Entrance Stairs-Rails: Right Home Layout: Two level;Able to live on main level with bedroom/bathroom     Bathroom Shower/Tub: Chief Strategy Officer: Standard     Home Equipment: Agricultural consultant (2 wheels);Cane - single point;Crutches          Prior Functioning/Environment Prior Level of Function : Independent/Modified Independent;Working/employed;Driving                        OT Problem List: Decreased activity tolerance;Decreased knowledge of use of DME or AE;Decreased knowledge of precautions;Pain      OT Treatment/Interventions:      OT Goals(Current goals can be found in the care plan section) Acute Rehab OT Goals Patient Stated Goal: home  OT Frequency:      Co-evaluation              AM-PAC OT "6 Clicks" Daily Activity     Outcome Measure Help from another person eating meals?: None Help from another person taking care of personal grooming?: None Help from another person toileting, which includes using toliet, bedpan, or urinal?: A Little Help from another person bathing (including washing, rinsing, drying)?: A Little Help from another person to put on and taking off regular upper body clothing?: None Help from another person to put on and taking off regular lower body clothing?: None 6 Click Score: 22   End of Session    Activity Tolerance: Patient limited by pain Patient left: in bed;with call bell/phone within reach  OT Visit Diagnosis: Unsteadiness on feet (R26.81);Pain                 Time: 4098-1191 OT Time Calculation (min): 10 min Charges:  OT General Charges $OT Visit: 1 Visit OT Evaluation $OT Eval Low Complexity: 1 Low  Raynald Kemp, OT Acute Rehabilitation Services Office: [phone]   Pilar Grammes 10/16/2022, 11:09 AM

## 2022-10-17 ENCOUNTER — Telehealth: Payer: Self-pay

## 2022-10-17 NOTE — Transitions of Care (Post Inpatient/ED Visit) (Signed)
   10/17/2022  Name: Jonathan Vaughn MRN: [id number] DOB: [birth year]  Today's TOC FU Call Status: Today's TOC FU Call Status:: Successful TOC FU Call Competed TOC FU Call Complete Date: 10/17/22  Transition Care Management Follow-up Telephone Call Date of Discharge: 10/16/22 Discharge Facility: [facility]) Type of Discharge: Inpatient Admission Primary Inpatient Discharge Diagnosis:: "fall, traumatic pneumothorax" How have you been since you were released from the hospital?: Better (Pt states he slept pretty well last night. His pain is reported as being "okay-taking pain meds that are helping.") Any questions or concerns?: No  Items Reviewed: Did you receive and understand the discharge instructions provided?: Yes Medications obtained and verified?: No Medications Not Reviewed Reasons:: Other: (pt states wife managing meds and currently at work-unable to review meds-he confirs they picked up all new prescriptions) Any new allergies since your discharge?: No Dietary orders reviewed?: Yes Type of Diet Ordered:: low salt/heart healthy Do you have support at home?: Yes People in Home: spouse Name of Support/Comfort Primary Source: [facility] and Equipment/Supplies: Were Home Health Services Ordered?: NA Any new equipment or medical supplies ordered?: Yes Name of Medical supply agency?: [id number] Were you able to get the equipment/medical supplies?: Yes Do you have any questions related to the use of the equipment/supplies?: No  Functional Questionnaire: Do you need assistance with bathing/showering or dressing?: No Do you need assistance with meal preparation?: Yes Do you need assistance with eating?: No Do you have difficulty maintaining continence: No Do you need assistance with getting out of bed/getting out of a chair/moving?: No Do you have difficulty managing or taking your medications?: Yes  Follow up appointments reviewed: PCP Follow-up appointment  confirmed?: NA Specialist Hospital Follow-up appointment confirmed?: No Reason Specialist Follow-Up Not Confirmed: Patient has Specialist Provider Number and will Call for Appointment (pt states wife will call and make follow up appt) Do you need transportation to your follow-up appointment?: No Do you understand care options if your condition(s) worsen?: Yes-patient verbalized understanding  SDOH Interventions Today    Flowsheet Row Most Recent Value  SDOH Interventions   Food Insecurity Interventions Intervention Not Indicated  Transportation Interventions Intervention Not Indicated       TOC Interventions Today    Flowsheet Row Most Recent Value  TOC Interventions   TOC Interventions Discussed/Reviewed TOC Interventions Discussed, Post discharge activity limitations per provider, Post op wound/incision care, S/S of infection      Interventions Today    Flowsheet Row Most Recent Value  General Interventions   General Interventions Discussed/Reviewed General Interventions Discussed, Doctor Visits  Doctor Visits Discussed/Reviewed Doctor Visits Discussed, Specialist  PCP/Specialist Visits Compliance with follow-up visit  Education Interventions   Education Provided Provided Education  Provided Verbal Education On Nutrition, Medication, When to see the doctor, Other  [pain mgmt, bowel regimen]  Nutrition Interventions   Nutrition Discussed/Reviewed Nutrition Discussed, Adding fruits and vegetables, Increaing proteins  Pharmacy Interventions   Pharmacy Dicussed/Reviewed Pharmacy Topics Discussed, Medications and their functions  Safety Interventions   Safety Discussed/Reviewed Safety Discussed        Alessandra Grout [facility] Care Management Care Management Community Coordinator Direct Phone: [phone] Toll Free: [phone] Fax: [phone]

## 2022-10-21 ENCOUNTER — Telehealth: Payer: Self-pay | Admitting: Adult Health

## 2022-10-21 NOTE — Telephone Encounter (Signed)
Prescription Request  10/21/2022  LOV: 08/12/2022  patient in accident, ribs broken, was previously filled by hospital  What is the name of the medication or equipment? oxyCODONE (OXY IR/ROXICODONE) 5 MG immediate release tablet   Have you contacted your pharmacy to request a refill? No   Which pharmacy would you like this sent to?  [id number] - [place], [place] - [address] AT [facility] [address] [place] [address] Phone: [phone] Fax: [phone]   Patient notified that their request is being sent to the clinical staff for review and that they should receive a response within 2 business days.   Please advise at Mobile [phone] (mobile)

## 2022-10-22 ENCOUNTER — Ambulatory Visit (INDEPENDENT_AMBULATORY_CARE_PROVIDER_SITE_OTHER): Payer: 59 | Admitting: Family Medicine

## 2022-10-22 ENCOUNTER — Ambulatory Visit (INDEPENDENT_AMBULATORY_CARE_PROVIDER_SITE_OTHER): Payer: 59

## 2022-10-22 VITALS — BP 108/66 | HR 78 | Temp 97.4°F | Wt 264.2 lb

## 2022-10-22 DIAGNOSIS — R0782 Intercostal pain: Secondary | ICD-10-CM

## 2022-10-22 DIAGNOSIS — W11XXXD Fall on and from ladder, subsequent encounter: Secondary | ICD-10-CM | POA: Diagnosis not present

## 2022-10-22 DIAGNOSIS — S270XXD Traumatic pneumothorax, subsequent encounter: Secondary | ICD-10-CM

## 2022-10-22 DIAGNOSIS — S2242XD Multiple fractures of ribs, left side, subsequent encounter for fracture with routine healing: Secondary | ICD-10-CM | POA: Diagnosis not present

## 2022-10-22 MED ORDER — OXYCODONE HCL 5 MG PO TABS
5.0000 mg | ORAL_TABLET | Freq: Four times a day (QID) | ORAL | 0 refills | Status: DC | PRN
Start: 2022-10-22 — End: 2023-01-26

## 2022-10-22 NOTE — Progress Notes (Signed)
Established Patient Office Visit   Subjective  Patient ID: Jonathan Vaughn, male    DOB: 23-Feb-1976  Age: 47 y.o. MRN: [id number]  Chief Complaint  Patient presents with   Rib Injury    Larey Seat and broke ribs, last night pain started all over again, in back and under breast, now in pain. Feels like he just fell off ladder again. Had spasms and took some hydocodeine , and oxy from earlier and was able to sleep but now is in pain and feels a popping in side and back. Also had some sleepy time tea last night     Patient is a 47 year old male who presents for HFU.  Patient seen in ED 4/24 s/p fall from ladder.  Pt with several L posterior lateral minimally displaced rib fxs. A small pneumothorax initially noted on chest CT resolved on x-ray from 5/25.  Pt was feeling a little better but pain and spasms increased making it difficult to sleep.  Took hydrocodone and oxycodone.  Uncomfortable with taking a deep breath in.      ROS Negative unless stated above    Objective:     BP 108/66 (BP Location: Right Arm, Patient Position: Sitting, Cuff Size: Large)   Pulse 78   Temp (!) 97.4 F (36.3 C) (Oral)   Wt 264 lb 3.2 oz (119.8 kg)   SpO2 94%   BMI 33.92 kg/m    Physical Exam Constitutional:      General: He is in acute distress.     Appearance: Normal appearance.  HENT:     Head: Normocephalic and atraumatic.     Mouth/Throat:     Mouth: Mucous membranes are moist.  Eyes:     Conjunctiva/sclera: Conjunctivae normal.  Cardiovascular:     Rate and Rhythm: Normal rate and regular rhythm.     Pulses: Normal pulses.     Heart sounds: Normal heart sounds.  Pulmonary:     Effort: Pulmonary effort is normal.     Breath sounds: Normal breath sounds.  Chest:     Chest wall: Tenderness present.  Abdominal:     Palpations: Abdomen is soft.  Musculoskeletal:     Comments: TTP of L flank and L rib cage posteriorly.  No step offs, crepitus, or displacement noted.  Skin:    General:  Skin is warm and dry.  Neurological:     Mental Status: He is alert and oriented to person, place, and time.      No results found for any visits on 10/22/22.    Assessment & Plan:  Intercostal pain -     oxyCODONE HCl; Take 1 tablet (5 mg total) by mouth every 6 (six) hours as needed for moderate pain.  Dispense: 20 tablet; Refill: 0 -     DG Chest 2 View  Closed fracture of multiple ribs of left side with routine healing, subsequent encounter -     oxyCODONE HCl; Take 1 tablet (5 mg total) by mouth every 6 (six) hours as needed for moderate pain.  Dispense: 20 tablet; Refill: 0 -     DG Chest 2 View  Traumatic pneumothorax, subsequent encounter -     DG Chest 2 View  Fall from ladder, subsequent encounter  Obtain CXR to evaluate for return of previously resolved small traumatic pneumothorax or increased placement of known rib fractures.  Patient advised on likely duration of symptoms.  Encouraged to take deep breaths and use incentive parameter to help prevent atelectasis and  possible pneumonia.  Given limited supply of pain medication.  PDMP reviewed.  Advised to alternate with Tylenol and to use other supportive measures.  Encouraged to keep follow-up appointments.  Return if symptoms worsen or fail to improve.   Deeann Saint, MD

## 2022-10-23 NOTE — Telephone Encounter (Signed)
Has been filled by another provider

## 2022-11-03 ENCOUNTER — Encounter: Payer: Self-pay | Admitting: Family Medicine

## 2022-11-11 ENCOUNTER — Ambulatory Visit: Payer: 59 | Admitting: Family Medicine

## 2023-01-25 ENCOUNTER — Encounter (HOSPITAL_COMMUNITY): Payer: Self-pay

## 2023-01-25 ENCOUNTER — Ambulatory Visit (HOSPITAL_COMMUNITY)
Admission: EM | Admit: 2023-01-25 | Discharge: 2023-01-25 | Disposition: A | Discharge status: Home / Self Care | Payer: 59 | Attending: Physician Assistant | Admitting: Physician Assistant

## 2023-01-25 DIAGNOSIS — H60502 Unspecified acute noninfective otitis externa, left ear: Secondary | ICD-10-CM

## 2023-01-25 MED ORDER — KETOROLAC TROMETHAMINE 60 MG/2ML IM SOLN
30.0000 mg | Freq: Once | INTRAMUSCULAR | Status: AC
  Administered 2023-01-25: 30 mg via INTRAMUSCULAR

## 2023-01-25 MED ORDER — KETOROLAC TROMETHAMINE 30 MG/ML IJ SOLN
INTRAMUSCULAR | Status: AC
  Filled 2023-01-25: qty 1

## 2023-01-25 MED ORDER — OFLOXACIN 0.3 % OT SOLN: 5.0000 [drp] | Freq: Two times a day (BID) | OTIC | 0 refills | Status: DC

## 2023-01-25 NOTE — Discharge Instructions (Signed)
Use drops as directed Do not take any ibuprofen or Aleve until after 11 pm tonight due to injection for Toradol received today.

## 2023-01-25 NOTE — ED Provider Notes (Signed)
[facility]    CSN: [id number] Arrival date & time: 01/25/23  1024      History   Chief Complaint Chief Complaint  Patient presents with   Otalgia    HPI Jonathan Vaughn is a 47 y.o. male.   Patient here c/w L ear pain x 1 week.  No recent swimming.  Admits pain with palpation, swelling, discharge from ear.  No advil or tylenol today.  No URI sx.    Past Medical History:  Diagnosis Date   Diabetes mellitus without complication (HCC)    False positive QuantiFERON-TB Gold test    GERD 03/21/2010   Qualifier: Diagnosis of  By: Nelson-Smith CMA (AAMA), Dottie     Gout    Headache(784.0) 01/10/2009   Qualifier: Diagnosis of  By: Gabriel Rung LPN, Nancy     Incarcerated incisional hernia 11/22/2018   LATERAL EPICONDYLITIS OF ELBOW 08/03/2009   Qualifier: Diagnosis of  By: Caryl Never MD, Hart [place] PAIN 01/23/2010   Qualifier: Diagnosis of  By: Gabriel Rung LPN, Harriett Sine      Patient Active Problem List   Diagnosis Date Noted   Pneumothorax on left 10/15/2022   Pain in right foot 11/20/2020   Mixed hyperlipidemia 10/25/2019   Incarcerated incisional hernia 11/22/2018   Tobacco use disorder 11/26/2016   Gout 11/26/2016   GERD 03/21/2010   GASTRITIS 03/21/2010   SHOULDER PAIN 01/23/2010   LATERAL EPICONDYLITIS OF ELBOW 08/03/2009   HEADACHE 01/10/2009    Past Surgical History:  Procedure Laterality Date   CHOLECYSTECTOMY N/A 09/03/2016   Procedure: LAPAROSCOPIC CHOLECYSTECTOMY;  Surgeon: Emelia Loron, MD;  Location: [facility] OR;  Service: General;  Laterality: N/A;   INSERTION OF MESH N/A 11/22/2018   Procedure: Insertion Of Mesh;  Surgeon: Claud Kelp, MD;  Location: [facility] OR;  Service: General;  Laterality: N/A;   TONSILLECTOMY     VENTRAL HERNIA REPAIR N/A 11/22/2018   Procedure: LAPAROSCOPIC REPAIR VENTRAL HERNIA WITH MESH;  Surgeon: Claud Kelp, MD;  Location: [facility] OR;  Service: General;  Laterality: N/A;       Home Medications    Prior to Admission  medications   Medication Sig Start Date End Date Taking? Authorizing Provider  ibuprofen (ADVIL) 600 MG tablet Take 1 tablet (600 mg total) by mouth every 8 (eight) hours as needed. 10/16/22 10/16/23 Yes Barnetta Chapel, PA-C  ofloxacin (FLOXIN) 0.3 % OTIC solution Place 5 drops into the left ear 2 (two) times daily. 01/25/23  Yes Evern Core, PA-C  acetaminophen (TYLENOL) 500 MG tablet Take 2 tablets (1,000 mg total) by mouth every 6 (six) hours. 10/16/22   Barnetta Chapel, PA-C  Continuous Blood Gluc Sensor (FREESTYLE LIBRE 3 SENSOR) MISC 1 Device by Does not apply route every 14 (fourteen) days. Place 1 sensor on the skin every 14 days. Use to check glucose continuously 07/11/22   Nafziger, Kandee Keen, NP  Dulaglutide (TRULICITY) 0.75 MG/0.5ML SOPN INJECT 0.75 MG SUBCUTANEOUSLY ONE TIME PER WEEK Patient taking differently: Inject 0.75 mg into the skin once a week. Tuesday 10/07/22   Shirline Frees, NP  metFORMIN (GLUCOPHAGE) 500 MG tablet Take 2 tablets (1,000 mg total) by mouth 2 (two) times daily with a meal. Patient taking differently: Take 500 mg by mouth daily as needed (for high glucose reading). 08/01/22 10/30/22  Nafziger, Kandee Keen, NP  methocarbamol (ROBAXIN) 500 MG tablet Take 1 tablet (500 mg total) by mouth every 8 (eight) hours as needed for muscle spasms. 10/16/22   Barnetta Chapel, PA-C  oxyCODONE (OXY IR/ROXICODONE) 5 MG immediate release tablet Take 1 tablet (5 mg total) by mouth every 6 (six) hours as needed for moderate pain. 10/22/22   Deeann Saint, MD    Family History Family History  Problem Relation Age of Onset   Crohn's disease Other    Gout Father     Social History Social History   Tobacco Use   Smoking status: Former    Current packs/day: 0.00    Average packs/day: 3.0 packs/day for 15.0 years (45.0 ttl pk-yrs)    Types: Cigarettes    Start date: 06/23/2001    Quit date: 06/23/2016    Years since quitting: 6.5   Smokeless tobacco: Never  Vaping Use   Vaping status: Never  Used  Substance Use Topics   Alcohol use: Not Currently    Comment: pt states he no longer drinks- quit in 2018   Drug use: No     Allergies   Lisinopril, Reglan [metoclopramide], and Azithromycin   Review of Systems Review of Systems  Constitutional:  Negative for chills, fatigue and fever.  HENT:  Positive for ear discharge and ear pain. Negative for congestion, facial swelling, hearing loss, nosebleeds, postnasal drip, rhinorrhea, sinus pressure, sinus pain and sore throat.   Eyes:  Negative for pain and redness.  Respiratory:  Negative for cough, shortness of breath and wheezing.   Gastrointestinal:  Negative for abdominal pain, diarrhea, nausea and vomiting.  Musculoskeletal:  Negative for arthralgias and myalgias.  Skin:  Negative for rash.  Neurological:  Negative for light-headedness and headaches.  Hematological:  Negative for adenopathy. Does not bruise/bleed easily.  Psychiatric/Behavioral:  Negative for confusion and sleep disturbance.      Physical Exam Triage Vital Signs ED Triage Vitals  Encounter Vitals Group     BP 01/25/23 1048 (!) 141/94     Systolic BP Percentile --      Diastolic BP Percentile --      Pulse Rate 01/25/23 1048 73     Resp 01/25/23 1048 16     Temp 01/25/23 1048 97.8 F (36.6 C)     Temp Source 01/25/23 1048 Oral     SpO2 01/25/23 1048 98 %     Weight 01/25/23 1047 230 lb (104.3 kg)     Height 01/25/23 1047 6\' 1"  (1.854 m)     Head Circumference --      Peak Flow --      Pain Score 01/25/23 1046 10     Pain Loc --      Pain Education --      Exclude from Growth Chart --    No data found.  Updated Vital Signs BP (!) 141/94 (BP Location: Left Arm)   Pulse 73   Temp 97.8 F (36.6 C) (Oral)   Resp 16   Ht 6\' 1"  (1.854 m)   Wt 230 lb (104.3 kg)   SpO2 98%   BMI 30.34 kg/m   Visual Acuity Right Eye Distance:   Left Eye Distance:   Bilateral Distance:    Right Eye Near:   Left Eye Near:    Bilateral Near:      Physical Exam Vitals and nursing note reviewed.  Constitutional:      General: He is not in acute distress.    Appearance: Normal appearance. He is not ill-appearing.  HENT:     Head: Normocephalic and atraumatic.     Right Ear: Tympanic membrane and ear canal normal.  Left Ear: Tympanic membrane and ear canal normal. Swelling and tenderness present. There is no impacted cerumen. No foreign body. No mastoid tenderness.     Ears:     Comments: Pain with manipulation of ear Unable to visualize TM completely due to swelling    Nose: No congestion or rhinorrhea.     Mouth/Throat:     Pharynx: No oropharyngeal exudate or posterior oropharyngeal erythema.  Eyes:     General: No scleral icterus.    Extraocular Movements: Extraocular movements intact.     Conjunctiva/sclera: Conjunctivae normal.  Pulmonary:     Effort: Pulmonary effort is normal. No respiratory distress.  Musculoskeletal:     Cervical back: Normal range of motion. No rigidity.  Skin:    Capillary Refill: Capillary refill takes less than 2 seconds.     Coloration: Skin is not jaundiced.     Findings: No rash.  Neurological:     General: No focal deficit present.     Mental Status: He is alert and oriented to person, place, and time.     Motor: No weakness.     Gait: Gait normal.  Psychiatric:        Mood and Affect: Mood normal.        Behavior: Behavior normal.      UC Treatments / Results  Labs (all labs ordered are listed, but only abnormal results are displayed) Labs Reviewed - No data to display  EKG   Radiology No results found.  Procedures Procedures (including critical care time)  Medications Ordered in UC Medications  ketorolac (TORADOL) injection 30 mg (30 mg Intramuscular Given 01/25/23 1125)    Initial Impression / Assessment and Plan / UC Course  I have reviewed the triage vital signs and the nursing notes.  Pertinent labs & imaging results that were available during my care of  the patient were reviewed by me and considered in my medical decision making (see chart for details).     Use drops as directed Return or follow up with PCP in 2 - 3 days if no improvement or worsening symptoms Final Clinical Impressions(s) / UC Diagnoses   Final diagnoses:  Acute otitis externa of left ear, unspecified type     Discharge Instructions      Use drops as directed Do not take any ibuprofen or Aleve until after 11 pm tonight due to injection for Toradol received today.    ED Prescriptions     Medication Sig Dispense Auth. Provider   ofloxacin (FLOXIN) 0.3 % OTIC solution Place 5 drops into the left ear 2 (two) times daily. 5 mL Evern Core, PA-C      PDMP not reviewed this encounter.   Evern Core, PA-C 01/25/23 1137

## 2023-01-25 NOTE — ED Triage Notes (Signed)
earache pain left side 7 days. Patient states the pain went away at one point and then 2 nights came back worse. States feels a lot of pressure and blockage in his ear with reduced hearing. Patient states it hurts to chew.

## 2023-01-26 ENCOUNTER — Other Ambulatory Visit: Payer: Self-pay

## 2023-01-26 ENCOUNTER — Emergency Department (HOSPITAL_COMMUNITY)
Admission: EM | Admit: 2023-01-26 | Discharge: 2023-01-27 | Discharge status: Against Medical Advice | Payer: 59 | Source: Home / Self Care

## 2023-01-26 ENCOUNTER — Emergency Department (HOSPITAL_COMMUNITY): Payer: 59

## 2023-01-26 ENCOUNTER — Encounter (HOSPITAL_COMMUNITY): Payer: Self-pay

## 2023-01-26 ENCOUNTER — Emergency Department (HOSPITAL_COMMUNITY)
Admission: EM | Admit: 2023-01-26 | Discharge: 2023-01-26 | Disposition: A | Discharge status: Home / Self Care | Payer: 59 | Attending: Emergency Medicine | Admitting: Emergency Medicine

## 2023-01-26 DIAGNOSIS — H60502 Unspecified acute noninfective otitis externa, left ear: Secondary | ICD-10-CM | POA: Diagnosis not present

## 2023-01-26 DIAGNOSIS — Z5321 Procedure and treatment not carried out due to patient leaving prior to being seen by health care provider: Secondary | ICD-10-CM | POA: Insufficient documentation

## 2023-01-26 DIAGNOSIS — H9202 Otalgia, left ear: Secondary | ICD-10-CM | POA: Insufficient documentation

## 2023-01-26 DIAGNOSIS — Z7984 Long term (current) use of oral hypoglycemic drugs: Secondary | ICD-10-CM | POA: Diagnosis not present

## 2023-01-26 DIAGNOSIS — E119 Type 2 diabetes mellitus without complications: Secondary | ICD-10-CM | POA: Insufficient documentation

## 2023-01-26 DIAGNOSIS — Z7985 Long-term (current) use of injectable non-insulin antidiabetic drugs: Secondary | ICD-10-CM | POA: Insufficient documentation

## 2023-01-26 DIAGNOSIS — H6092 Unspecified otitis externa, left ear: Secondary | ICD-10-CM | POA: Diagnosis not present

## 2023-01-26 DIAGNOSIS — H9209 Otalgia, unspecified ear: Secondary | ICD-10-CM | POA: Diagnosis not present

## 2023-01-26 DIAGNOSIS — M799 Soft tissue disorder, unspecified: Secondary | ICD-10-CM | POA: Diagnosis not present

## 2023-01-26 LAB — CBC
HCT: 41.7 % (ref 39.0–52.0)
Hemoglobin: 13.8 g/dL (ref 13.0–17.0)
MCH: 28.6 pg (ref 26.0–34.0)
MCHC: 33.1 g/dL (ref 30.0–36.0)
MCV: 86.5 fL (ref 80.0–100.0)
Platelets: 194 10*3/uL (ref 150–400)
RBC: 4.82 MIL/uL (ref 4.22–5.81)
RDW: 13.6 % (ref 11.5–15.5)
WBC: 7.5 10*3/uL (ref 4.0–10.5)
nRBC: 0 % (ref 0.0–0.2)

## 2023-01-26 LAB — BASIC METABOLIC PANEL
Anion gap: 11 (ref 5–15)
BUN: 16 mg/dL (ref 6–20)
CO2: 23 mmol/L (ref 22–32)
Calcium: 9 mg/dL (ref 8.9–10.3)
Chloride: 99 mmol/L (ref 98–111)
Creatinine, Ser: 1.07 mg/dL (ref 0.61–1.24)
GFR, Estimated: >60 mL/min (ref >60)
Glucose, Bld: 185 mg/dL — ABNORMAL HIGH (ref 70–99)
Potassium: 4 mmol/L (ref 3.5–5.1)
Sodium: 133 mmol/L — ABNORMAL LOW (ref 135–145)

## 2023-01-26 MED ORDER — OXYCODONE-ACETAMINOPHEN 5-325 MG PO TABS
1.0000 | ORAL_TABLET | Freq: Once | ORAL | Status: AC
  Administered 2023-01-26: 1 via ORAL
  Filled 2023-01-26: qty 1

## 2023-01-26 MED ORDER — CIPROFLOXACIN HCL 500 MG PO TABS: 500.0000 mg | ORAL_TABLET | Freq: Two times a day (BID) | ORAL | 0 refills | Status: AC

## 2023-01-26 MED ORDER — HYDROCODONE-ACETAMINOPHEN 5-325 MG PO TABS: 1.0000 | ORAL_TABLET | ORAL | 0 refills | Status: DC | PRN

## 2023-01-26 MED ORDER — OXYCODONE HCL 5 MG PO TABS: 5.0000 mg | ORAL_TABLET | ORAL | 0 refills | Status: DC | PRN

## 2023-01-26 MED ORDER — IOHEXOL 350 MG/ML SOLN
50.0000 mL | Freq: Once | INTRAVENOUS | Status: AC | PRN
  Administered 2023-01-26: 50 mL via INTRAVENOUS

## 2023-01-26 MED ORDER — IBUPROFEN 800 MG PO TABS: 800.0000 mg | ORAL_TABLET | Freq: Once | ORAL | Status: DC

## 2023-01-26 NOTE — ED Triage Notes (Signed)
Pt to ED with complaint of continued left otalgia. Seen yesterday for same. Reports pain and swelling is significantly worse.

## 2023-01-26 NOTE — ED Triage Notes (Signed)
Pt states that he has had an earache for the past week, seen at [facility] yesterday, given eardrops at [facility] yesterday not helping.

## 2023-01-26 NOTE — Discharge Instructions (Addendum)
Your workup today showed that you have an outer ear infection.  Please take the oral antibiotics as directed until finished.  Use the Norco as needed for pain.  You can also take anti-inflammatories such as ibuprofen.  Do not take additional Tylenol if you are taking Norco.  Please schedule an appointment with Dr. Jearld Fenton with ENT.  Return to the ER for any new or worsening symptoms.

## 2023-01-26 NOTE — ED Provider Notes (Signed)
[place] [facility] AT [facility] Provider Note   CSN: [id number] Arrival date & time: 01/26/23  0241     History  Chief Complaint  Patient presents with   Otalgia    Jonathan Vaughn is a 47 y.o. male.  HPI 47 year old male presents to the ER with concerns for left ear pain.  He noticed outer ear pain about a week ago, which progressively got worse.  He sought evaluation at urgent care yesterday and was given  ofloxacillin eardrops.  He reports that the pain continues to worsen.  He reports also some swelling around his ear as well.  Does report history of well-controlled diabetes.  Denies any recent swimming or ear trauma.    Home Medications Prior to Admission medications   Medication Sig Start Date End Date Taking? Authorizing Provider  acetaminophen (TYLENOL) 500 MG tablet Take 2 tablets (1,000 mg total) by mouth every 6 (six) hours. 10/16/22   Barnetta Chapel, PA-C  ciprofloxacin (CIPRO) 500 MG tablet Take 1 tablet (500 mg total) by mouth 2 (two) times daily for 7 days. 01/26/23 02/02/23 Yes Mare Ferrari, PA-C  Continuous Blood Gluc Sensor (FREESTYLE LIBRE 3 SENSOR) MISC 1 Device by Does not apply route every 14 (fourteen) days. Place 1 sensor on the skin every 14 days. Use to check glucose continuously 07/11/22   Nafziger, Kandee Keen, NP  Dulaglutide (TRULICITY) 0.75 MG/0.5ML SOPN INJECT 0.75 MG SUBCUTANEOUSLY ONE TIME PER WEEK Patient taking differently: Inject 0.75 mg into the skin once a week. Tuesday 10/07/22   Shirline Frees, NP  HYDROcodone-acetaminophen (NORCO/VICODIN) 5-325 MG tablet Take 1 tablet by mouth every 4 (four) hours as needed for up to 3 days. 01/26/23 01/29/23 Yes Mare Ferrari, PA-C  ibuprofen (ADVIL) 600 MG tablet Take 1 tablet (600 mg total) by mouth every 8 (eight) hours as needed. 10/16/22 10/16/23  Barnetta Chapel, PA-C  metFORMIN (GLUCOPHAGE) 500 MG tablet Take 2 tablets (1,000 mg total) by mouth 2 (two) times daily with a meal. Patient taking  differently: Take 500 mg by mouth daily as needed (for high glucose reading). 08/01/22 10/30/22  Nafziger, Kandee Keen, NP  methocarbamol (ROBAXIN) 500 MG tablet Take 1 tablet (500 mg total) by mouth every 8 (eight) hours as needed for muscle spasms. 10/16/22   Barnetta Chapel, PA-C  ofloxacin (FLOXIN) 0.3 % OTIC solution Place 5 drops into the left ear 2 (two) times daily. 01/25/23   Evern Core, PA-C  oxyCODONE (OXY IR/ROXICODONE) 5 MG immediate release tablet Take 1 tablet (5 mg total) by mouth every 6 (six) hours as needed for moderate pain. 10/22/22   Deeann Saint, MD      Allergies    Lisinopril, Reglan [metoclopramide], and Azithromycin    Review of Systems   Review of Systems Ten systems reviewed and are negative for acute change, except as noted in the HPI.   Physical Exam Updated Vital Signs BP (!) 145/99 (BP Location: Right Arm)   Pulse 87   Temp 98.1 F (36.7 C) (Oral)   Resp (!) 22   SpO2 97%  Physical Exam Vitals and nursing note reviewed.  Constitutional:      General: He is not in acute distress.    Appearance: He is well-developed.  HENT:     Head: Atraumatic.     Comments: Left ear with significant outer ear swelling, difficult to assess TM due to extent of your canal swelling.  He does have some mild surrounding erythema.  Mild mastoid  tenderness. Eyes:     Conjunctiva/sclera: Conjunctivae normal.  Cardiovascular:     Rate and Rhythm: Normal rate and regular rhythm.     Heart sounds: No murmur heard. Pulmonary:     Effort: Pulmonary effort is normal. No respiratory distress.     Breath sounds: Normal breath sounds.  Abdominal:     Palpations: Abdomen is soft.     Tenderness: There is no abdominal tenderness.  Musculoskeletal:        General: No swelling.     Cervical back: Neck supple.  Skin:    General: Skin is warm and dry.     Capillary Refill: Capillary refill takes less than 2 seconds.  Neurological:     Mental Status: He is alert.  Psychiatric:         Mood and Affect: Mood normal.     ED Results / Procedures / Treatments   Labs (all labs ordered are listed, but only abnormal results are displayed) Labs Reviewed  BASIC METABOLIC PANEL - Abnormal; Notable for the following components:      Result Value   Sodium 133 (*)    Glucose, Bld 185 (*)    All other components within normal limits  CBC    EKG None  Radiology CT Maxillofacial W Contrast  Result Date: 01/26/2023 CLINICAL DATA:  Earache for the past week. Ear drops are not helping. Concern for malignant otitis externa EXAM: CT MAXILLOFACIAL WITH CONTRAST TECHNIQUE: Multidetector CT imaging of the maxillofacial structures was performed with intravenous contrast. Multiplanar CT image reconstructions were also generated. RADIATION DOSE REDUCTION: This exam was performed according to the departmental dose-optimization program which includes automated exposure control, adjustment of the mA and/or kV according to patient size and/or use of iterative reconstruction technique. CONTRAST:  50mL OMNIPAQUE IOHEXOL 350 MG/ML SOLN COMPARISON:  Head CT 07/14/2022 FINDINGS: Osseous: No external auditory canal erosion to imply malignant otitis externa. No abscess is seen either including around the left external auditory canal soft tissue thickening. Mild left mastoid and middle ear opacification. Symmetric and unremarkable nasopharynx. Orbits: Negative Sinuses: Clear sinuses Soft tissues: As above Limited intracranial: Asymmetric density at the left lateral ventricular body is attributed to choroid and similar to prior head CT. IMPRESSION: Otitis externa on the left without abscess or malignant changes. Mild left otomastoid opacification. Electronically Signed   By: Tiburcio Pea M.D.   On: 01/26/2023 05:22    Procedures Procedures    Medications Ordered in ED Medications  oxyCODONE-acetaminophen (PERCOCET/ROXICET) 5-325 MG per tablet 1 tablet (1 tablet Oral Given 01/26/23 0412)  iohexol  (OMNIPAQUE) 350 MG/ML injection 50 mL (50 mLs Intravenous Contrast Given 01/26/23 0511)    ED Course/ Medical Decision Making/ A&P                                 Medical Decision Making Amount and/or Complexity of Data Reviewed Labs: ordered. Radiology: ordered.  Risk Prescription drug management.   48 year old male presenting with complaints of left ear pain.  Recently diagnosed with otitis externa, started on ofloxacin eardrops yesterday but pain continues to get worse.  He has significant outer ear swelling noted with surrounding erythema and warmth.  Given his history of diabetes, there is some concern for possible malignant otitis externa.  Patient was evaluated by Dr. Orland Dec as well.  CT max face obtained to better evaluate this.  I reviewed this, agree with radiology read.  Negative for  acute malignant otitis externa, findings consistent with otitis externa infection.  Will discharge with course of Cipro and a few pills of Norco for pain control.  Will refer to ENT.  We discussed return precautions.  He voiced understanding and is agreeable.  Stable for discharge. This was a shared visit with my supervising physician Dr. Pilar Plate who independently saw and evaluated the patient & provided guidance in evaluation/management/disposition ,in agreement with care   Final Clinical Impression(s) / ED Diagnoses Final diagnoses:  Acute otitis externa of left ear, unspecified type    Rx / DC Orders ED Discharge Orders          Ordered    ciprofloxacin (CIPRO) 500 MG tablet  2 times daily        01/26/23 0541    HYDROcodone-acetaminophen (NORCO/VICODIN) 5-325 MG tablet  Every 4 hours PRN        01/26/23 0541              Mare Ferrari, PA-C 01/26/23 0543    Sabas Sous, MD 01/26/23 [phone]

## 2023-01-27 ENCOUNTER — Encounter: Payer: Self-pay | Admitting: Internal Medicine

## 2023-01-27 ENCOUNTER — Ambulatory Visit (INDEPENDENT_AMBULATORY_CARE_PROVIDER_SITE_OTHER): Payer: 59 | Admitting: Internal Medicine

## 2023-01-27 VITALS — BP 130/90 | HR 97 | Temp 97.9°F | Wt 255.0 lb

## 2023-01-27 DIAGNOSIS — H60392 Other infective otitis externa, left ear: Secondary | ICD-10-CM | POA: Diagnosis not present

## 2023-01-27 MED ORDER — OXYCODONE HCL 5 MG PO TABS: 5.0000 mg | ORAL_TABLET | ORAL | 0 refills | Status: DC | PRN

## 2023-01-27 NOTE — Progress Notes (Signed)
Established Patient Office Visit     CC/Reason for Visit: Follow-up otitis externa  HPI: Jonathan Vaughn is a 47 y.o. male who is coming in today for the above mentioned reasons.  He has developed a left otitis externa.  He had an initial urgent care visit on 8/4 and was given ofloxacin drops.  He did not notice any improvement and went to the emergency department on 8/5.  It appears they wanted to rule out malignant otitis externa and a maxillofacial CT scan was done that showed left otitis externa without abscess or malignant changes.  He was given Cipro for a week and 8 tablets of oxycodone.  He has only been on antibiotics for less than 24 hours.  Still with significant pain.  Requesting refill of pain medication.   Past Medical/Surgical History: Past Medical History:  Diagnosis Date   Diabetes mellitus without complication (HCC)    False positive QuantiFERON-TB Gold test    GERD 03/21/2010   Qualifier: Diagnosis of  By: Nelson-Smith CMA (AAMA), Dottie     Gout    Headache(784.0) 01/10/2009   Qualifier: Diagnosis of  By: Gabriel Rung LPN, Nancy     Incarcerated incisional hernia 11/22/2018   LATERAL EPICONDYLITIS OF ELBOW 08/03/2009   Qualifier: Diagnosis of  By: Caryl Never MD, Hart [place] PAIN 01/23/2010   Qualifier: Diagnosis of  By: Gabriel Rung LPN, Harriett Sine      Past Surgical History:  Procedure Laterality Date   CHOLECYSTECTOMY N/A 09/03/2016   Procedure: LAPAROSCOPIC CHOLECYSTECTOMY;  Surgeon: Emelia Loron, MD;  Location: [facility] OR;  Service: General;  Laterality: N/A;   INSERTION OF MESH N/A 11/22/2018   Procedure: Insertion Of Mesh;  Surgeon: Claud Kelp, MD;  Location: [facility] OR;  Service: General;  Laterality: N/A;   TONSILLECTOMY     VENTRAL HERNIA REPAIR N/A 11/22/2018   Procedure: LAPAROSCOPIC REPAIR VENTRAL HERNIA WITH MESH;  Surgeon: Claud Kelp, MD;  Location: [facility] OR;  Service: General;  Laterality: N/A;    Social History:  reports that he quit smoking  about 6 years ago. His smoking use included cigarettes. He started smoking about 21 years ago. He has a 45 pack-year smoking history. He has never used smokeless tobacco. He reports that he does not currently use alcohol. He reports that he does not use drugs.  Allergies: Allergies  Allergen Reactions   Lisinopril Anaphylaxis   Reglan [Metoclopramide] Other (See Comments)    Tick, twitching, jittery    Azithromycin Rash    Family History:  Family History  Problem Relation Age of Onset   Crohn's disease Other    Gout Father      Current Outpatient Medications:    acetaminophen (TYLENOL) 500 MG tablet, Take 2 tablets (1,000 mg total) by mouth every 6 (six) hours., Disp: , Rfl:    ciprofloxacin (CIPRO) 500 MG tablet, Take 1 tablet (500 mg total) by mouth 2 (two) times daily for 7 days., Disp: 14 tablet, Rfl: 0   Continuous Blood Gluc Sensor (FREESTYLE LIBRE 3 SENSOR) MISC, 1 Device by Does not apply route every 14 (fourteen) days. Place 1 sensor on the skin every 14 days. Use to check glucose continuously, Disp: 2 each, Rfl: 6   Dulaglutide (TRULICITY) 0.75 MG/0.5ML SOPN, INJECT 0.75 MG SUBCUTANEOUSLY ONE TIME PER WEEK (Patient taking differently: Inject 0.75 mg into the skin once a week. Tuesday), Disp: 6 mL, Rfl: 1   ibuprofen (ADVIL) 600 MG tablet, Take 1 tablet (600 mg total)  by mouth every 8 (eight) hours as needed., Disp: 20 tablet, Rfl: 0   Indomethacin 20 MG CAPS, , Disp: , Rfl:    methocarbamol (ROBAXIN) 500 MG tablet, Take 1 tablet (500 mg total) by mouth every 8 (eight) hours as needed for muscle spasms., Disp: 50 tablet, Rfl: 0   ofloxacin (FLOXIN) 0.3 % OTIC solution, Place 5 drops into the left ear 2 (two) times daily., Disp: 5 mL, Rfl: 0   metFORMIN (GLUCOPHAGE) 500 MG tablet, Take 2 tablets (1,000 mg total) by mouth 2 (two) times daily with a meal. (Patient taking differently: Take 500 mg by mouth daily as needed (for high glucose reading).), Disp: 180 tablet, Rfl: 1    oxyCODONE (ROXICODONE) 5 MG immediate release tablet, Take 1 tablet (5 mg total) by mouth every 4 (four) hours as needed for severe pain., Disp: 20 tablet, Rfl: 0  Review of Systems:  Negative unless indicated in HPI.   Physical Exam: Vitals:   01/27/23 1417  BP: (!) 130/90  Pulse: 97  Temp: 97.9 F (36.6 C)  TempSrc: Axillary  SpO2: 95%  Weight: 255 lb (115.7 kg)    Body mass index is 33.64 kg/m.   Physical Exam HENT:     Right Ear: Tympanic membrane normal.     Left Ear: Swelling present.      Impression and Plan:  Other infective acute otitis externa of left ear -     oxyCODONE HCl; Take 1 tablet (5 mg total) by mouth every 4 (four) hours as needed for severe pain.  Dispense: 20 tablet; Refill: 0  -He is being treated correctly with Cipro twice daily for a week.  ENT cannot see him for a month.  Have advised he should start to feel better after approximately 72 hours on antibiotics.  I will refill his oxycodone (20 tablets provided) and have asked him to rotate this with ibuprofen for pain relief.   Time spent:22 minutes reviewing chart, interviewing and examining patient and formulating plan of care.     Chaya Jan, MD [facility]

## 2023-03-19 ENCOUNTER — Telehealth: Payer: Self-pay | Admitting: Adult Health

## 2023-03-19 NOTE — Telephone Encounter (Signed)
Pt's sister, Junious Dresser called wanting to do a TOC for her brother from Dr Shirline Frees to Dr Janee Morn because pt has got different health concerns and he was referred by a friend who is Dr Carollee Massed patient. Pls advise. Call back number [phone]

## 2023-03-24 NOTE — Telephone Encounter (Signed)
Pt has been scheduled.  °

## 2023-04-03 ENCOUNTER — Other Ambulatory Visit (HOSPITAL_BASED_OUTPATIENT_CLINIC_OR_DEPARTMENT_OTHER): Payer: Self-pay

## 2023-04-09 ENCOUNTER — Ambulatory Visit (INDEPENDENT_AMBULATORY_CARE_PROVIDER_SITE_OTHER): Payer: 59 | Admitting: Family Medicine

## 2023-04-09 ENCOUNTER — Encounter: Payer: Self-pay | Admitting: Family Medicine

## 2023-04-09 VITALS — BP 128/82 | HR 70 | Temp 97.6°F | Ht 73.0 in | Wt 265.2 lb

## 2023-04-09 DIAGNOSIS — Z7985 Long-term (current) use of injectable non-insulin antidiabetic drugs: Secondary | ICD-10-CM | POA: Diagnosis not present

## 2023-04-09 DIAGNOSIS — F322 Major depressive disorder, single episode, severe without psychotic features: Secondary | ICD-10-CM | POA: Diagnosis not present

## 2023-04-09 DIAGNOSIS — E1169 Type 2 diabetes mellitus with other specified complication: Secondary | ICD-10-CM

## 2023-04-09 DIAGNOSIS — F419 Anxiety disorder, unspecified: Secondary | ICD-10-CM

## 2023-04-09 DIAGNOSIS — E1165 Type 2 diabetes mellitus with hyperglycemia: Secondary | ICD-10-CM | POA: Diagnosis not present

## 2023-04-09 DIAGNOSIS — R45851 Suicidal ideations: Secondary | ICD-10-CM

## 2023-04-09 LAB — POCT GLYCOSYLATED HEMOGLOBIN (HGB A1C): Hemoglobin A1C: 7.6 % — AB (ref 4.0–5.6)

## 2023-04-09 MED ORDER — TRULICITY 0.75 MG/0.5ML ~~LOC~~ SOAJ: 0.7500 mg | SUBCUTANEOUS | 0 refills | Status: AC

## 2023-04-09 MED ORDER — FLUOXETINE HCL 10 MG PO CAPS: ORAL_CAPSULE | ORAL | 0 refills | Status: DC

## 2023-04-09 NOTE — Progress Notes (Signed)
Assessment/Plan:   Problem List Items Addressed This Visit       Endocrine   Type 2 diabetes mellitus with hyperglycemia, without long-term current use of insulin (HCC) - Primary    Patient's diabetes is currently uncontrolled with an A1c of 7.6%; patient has not been taking prescribed medications.  Plan: Restart Trulicity 0.75 mg once weekly. Educate the patient on the importance of medication adherence. Encourage blood glucose monitoring.      Relevant Medications   FLUoxetine (PROZAC) 10 MG capsule   Dulaglutide (TRULICITY) 0.75 MG/0.5ML SOPN   Other Relevant Orders   POCT glycosylated hemoglobin (Hb A1C) (Completed)     Other   Anxiety   Relevant Medications   FLUoxetine (PROZAC) 10 MG capsule   Depression, major, single episode, severe (HCC)    Patient reports significant depressive symptoms impacting daily functioning.  Plan: Start Fluoxetine 10 mg once daily for 2 weeks, then increase to 20 mg as tolerated. Discuss potential benefits of cognitive behavioral therapy (CBT). Provide information for local mental health resources and crisis hotline.      Relevant Medications   FLUoxetine (PROZAC) 10 MG capsule    Medications Discontinued During This Encounter  Medication Reason   ofloxacin (FLOXIN) 0.3 % OTIC solution    methocarbamol (ROBAXIN) 500 MG tablet    Indomethacin 20 MG CAPS    oxyCODONE (ROXICODONE) 5 MG immediate release tablet    Dulaglutide (TRULICITY) 0.75 MG/0.5ML SOPN Reorder    Return in about 2 weeks (around 04/23/2023) for fasting labs, DM.    Subjective:   Encounter date: 04/09/2023  Jonathan Vaughn is a 47 y.o. male who has GERD; Gastritis and gastroduodenitis; SHOULDER PAIN; Lateral epicondylitis; Headache; Tobacco use disorder; Gout; Incarcerated incisional hernia; Mixed hyperlipidemia; Pain in right foot; Pneumothorax on left; Anxiety; Depression, major, single episode, severe (HCC); and Type 2 diabetes mellitus with hyperglycemia,  without long-term current use of insulin (HCC) on their problem list..   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   Chief Complaint: Depression and uncontrolled diabetes.  History of Present Illness:   Patient has been experiencing significant depressive symptoms for the past year causing a loss of interest in previously enjoyed activities like fishing and hunting. They report persistent sadness, crying episodes, and difficulty enjoying social activities.  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  Additionally, there's a history of diabetes, but the patient has not been compliant with medications due to depression, specifically Metformin and Trulicity, leading to fluctuating blood glucose levels. The patient's A1c level today is 7.6%.      04/09/2023    9:00 AM 07/15/2022    2:04 PM 07/11/2022    8:50 AM 08/10/2019    7:12 AM  Depression screen PHQ 2/9  Decreased Interest 2 0 3 0  Down, Depressed, Hopeless 3 0 0 0  PHQ - 2 Score 5 0 3 0  Altered sleeping 3  3   Tired, decreased energy 3  3   Change in appetite 3  3   Feeling bad or failure about yourself  3  0   Trouble concentrating 3  3   Moving slowly or fidgety/restless 2  0   Suicidal thoughts 0  0   PHQ-9 Score 22  15   Difficult doing work/chores Extremely dIfficult  Very difficult       04/09/2023    9:00 AM  GAD 7 : Generalized Anxiety Score  Nervous, Anxious, on Edge 2  Control/stop worrying 3  Worry too much - different things 3  Trouble relaxing 3  Restless 2  Easily annoyed or irritable 3  Afraid - awful might happen 2  Total GAD 7 Score 18  Anxiety Difficulty Extremely difficult      Review of Systems  Constitutional:  Positive for malaise/fatigue. Negative for chills, diaphoresis, fever and weight loss.  HENT:  Negative for congestion, ear discharge, ear pain and hearing loss.   Eyes:  Negative for blurred vision, double vision, photophobia, pain, discharge and redness.  Respiratory:  Negative for cough, sputum production, shortness of breath and wheezing.   Cardiovascular:  Negative for chest pain and palpitations.  Gastrointestinal:  Negative for abdominal pain, blood in stool, constipation, diarrhea, heartburn, melena, nausea and vomiting.  Genitourinary:  Positive for frequency. Negative for dysuria, flank pain, hematuria and urgency.  Musculoskeletal:  Negative for myalgias.  Skin:  Negative for itching and rash.  Neurological:  Negative for dizziness, tingling, tremors, speech change, seizures, loss of consciousness, weakness and headaches.  Endo/Heme/Allergies:  Positive for polydipsia.  Psychiatric/Behavioral:  Positive for depression. Negative for hallucinations, memory loss, substance abuse and suicidal ideas. The patient is nervous/anxious. The patient does not have insomnia.   All other systems reviewed and are negative.   Past Surgical History:  Procedure Laterality Date   CHOLECYSTECTOMY N/A 09/03/2016   Procedure: LAPAROSCOPIC CHOLECYSTECTOMY;  Surgeon: Emelia Loron, MD;  Location: [facility] OR;  Service: General;  Laterality: N/A;   INSERTION OF MESH N/A 11/22/2018   Procedure: Insertion Of Mesh;  Surgeon: Claud Kelp, MD;  Location: [facility] OR;  Service: General;  Laterality: N/A;   TONSILLECTOMY     VENTRAL HERNIA REPAIR N/A 11/22/2018   Procedure: LAPAROSCOPIC REPAIR VENTRAL HERNIA WITH MESH;  Surgeon: Claud Kelp, MD;  Location: [facility] OR;  Service: General;  Laterality: N/A;    Outpatient Medications Prior to Visit  Medication Sig Dispense Refill   acetaminophen (TYLENOL) 500 MG tablet Take 2 tablets (1,000 mg total) by mouth every 6 (six)  hours.     ibuprofen (ADVIL) 600 MG tablet Take 1 tablet (600 mg total) by mouth every 8 (eight) hours as needed. 20 tablet 0   Continuous Blood Gluc Sensor (FREESTYLE LIBRE 3 SENSOR) MISC 1 Device by Does not apply route every 14 (fourteen) days. Place 1 sensor on the skin every 14 days. Use to check glucose continuously (Patient not taking: Reported on 04/09/2023) 2 each 6   metFORMIN (GLUCOPHAGE) 500 MG tablet Take 2 tablets (1,000 mg total) by mouth 2 (two) times daily with a meal. (Patient taking differently: Take 500 mg by mouth daily as needed (for high glucose reading).) 180 tablet 1   Dulaglutide (TRULICITY) 0.75 MG/0.5ML SOPN INJECT 0.75 MG SUBCUTANEOUSLY ONE TIME PER WEEK (Patient not taking: Reported on 04/09/2023) 6 mL 1   Indomethacin 20 MG CAPS  (Patient not taking: Reported on 04/09/2023)     methocarbamol (ROBAXIN) 500 MG tablet Take 1 tablet (500 mg total) by mouth every 8 (eight) hours as needed for muscle spasms. (Patient not taking: Reported on 04/09/2023) 50 tablet 0   ofloxacin (FLOXIN) 0.3 % OTIC solution Place 5 drops into the left ear 2 (two) times daily. (Patient not taking: Reported on 04/09/2023) 5 mL 0   oxyCODONE (  ROXICODONE) 5 MG immediate release tablet Take 1 tablet (5 mg total) by mouth every 4 (four) hours as needed for severe pain. (Patient not taking: Reported on 04/09/2023) 20 tablet 0   No facility-administered medications prior to visit.    Family History  Problem Relation Age of Onset   Crohn's disease Other    Gout Father     Social History   Socioeconomic History   Marital status: Married    Spouse name: Not on file   Number of children: Not on file   Years of education: Not on file   Highest education level: Not on file  Occupational History   Not on file  Tobacco Use   Smoking status: Former    Current packs/day: 0.00    Average packs/day: 3.0 packs/day for 15.0 years (45.0 ttl pk-yrs)    Types: Cigarettes    Start date: 06/23/2001     Quit date: 06/23/2016    Years since quitting: 6.8    Passive exposure: Never   Smokeless tobacco: Never  Vaping Use   Vaping status: Never Used  Substance and Sexual Activity   Alcohol use: Not Currently    Comment: pt states he no longer drinks- quit in 2018   Drug use: No   Sexual activity: Yes  Other Topics Concern   Not on file  Social History Narrative   He is self employed - Corporate investment banker    Married    Has children       Social Determinants of Health   Financial Resource Strain: Not on file  Food Insecurity: No Food Insecurity (10/17/2022)   Hunger Vital Sign    Worried About Running Out of Food in the Last Year: Never true    Ran Out of Food in the Last Year: Never true  Transportation Needs: No Transportation Needs (10/17/2022)   PRAPARE - Administrator, Civil Service (Medical): No    Lack of Transportation (Non-Medical): No  Physical Activity: Not on file  Stress: Not on file  Social Connections: Not on file  Intimate Partner Violence: Not At Risk (10/16/2022)   Humiliation, Afraid, Rape, and Kick questionnaire    Fear of Current or Ex-Partner: No    Emotionally Abused: No    Physically Abused: No    Sexually Abused: No                                                                                                  Objective:  Physical Exam: BP 128/82 (BP Location: Right Arm, Patient Position: Sitting, Cuff Size: Large)   Pulse 70   Temp 97.6 F (36.4 C) (Temporal)   Ht 6\' 1"  (1.854 m)   Wt 265 lb 3.2 oz (120.3 kg)   SpO2 98%   BMI 34.99 kg/m     Physical Exam Constitutional:      Appearance: Normal appearance.  HENT:     Head: Normocephalic and atraumatic.     Right Ear: Hearing normal.     Left Ear: Hearing normal.     Nose: Nose normal.  Eyes:  General: No scleral icterus.       Right eye: No discharge.        Left eye: No discharge.     Extraocular Movements: Extraocular movements intact.  Cardiovascular:     Rate  and Rhythm: Normal rate and regular rhythm.     Heart sounds: Normal heart sounds.  Pulmonary:     Effort: Pulmonary effort is normal.     Breath sounds: Normal breath sounds.  Abdominal:     Palpations: Abdomen is soft.     Tenderness: There is no abdominal tenderness.  Skin:    General: Skin is warm.     Findings: No rash.  Neurological:     General: No focal deficit present.     Mental Status: He is alert.     Cranial Nerves: No cranial nerve deficit.  Psychiatric:        Mood and Affect: Mood is depressed. Affect is tearful.        Behavior: Behavior is cooperative.        Thought Content: Thought content normal. Thought content does not include homicidal or suicidal ideation.        Judgment: Judgment is not impulsive.     Prior labs:   Recent Results (from the past 2160 hour(s))  CBC     Status: None   Collection Time: 01/26/23  4:13 AM  Result Value Ref Range   WBC 7.5 4.0 - 10.5 K/uL   RBC 4.82 4.22 - 5.81 MIL/uL   Hemoglobin 13.8 13.0 - 17.0 g/dL   HCT 95.6 21.3 - 08.6 %   MCV 86.5 80.0 - 100.0 fL   MCH 28.6 26.0 - 34.0 pg   MCHC 33.1 30.0 - 36.0 g/dL   RDW 57.8 46.9 - 62.9 %   Platelets 194 150 - 400 K/uL   nRBC 0.0 0.0 - 0.2 %    Comment: Performed at [facility], [address], [place], [place] [address]  Basic metabolic panel     Status: Abnormal   Collection Time: 01/26/23  4:13 AM  Result Value Ref Range   Sodium 133 (L) 135 - 145 mmol/L   Potassium 4.0 3.5 - 5.1 mmol/L   Chloride 99 98 - 111 mmol/L   CO2 23 22 - 32 mmol/L   Glucose, Bld 185 (H) 70 - 99 mg/dL    Comment: Glucose reference range applies only to samples taken after fasting for at least 8 hours.   BUN 16 6 - 20 mg/dL   Creatinine, Ser 3.24 0.61 - 1.24 mg/dL   Calcium 9.0 8.9 - 40.1 mg/dL   GFR, Estimated >02 >72 mL/min    Comment: (NOTE) Calculated using the CKD-EPI Creatinine Equation (2021)    Anion gap 11 5 - 15    Comment: Performed at [facility], [address], [place], [place] [address]  POCT glycosylated hemoglobin (Hb A1C)     Status: Abnormal   Collection Time: 04/09/23  8:22 AM  Result Value Ref Range   Hemoglobin A1C 7.6 (A) 4.0 - 5.6 %   HbA1c POC (<> result, manual entry)     HbA1c, POC (prediabetic range)     HbA1c, POC (controlled diabetic range)      Lab Results  Component Value Date   CHOL 263 (H) 07/11/2022   CHOL 216 (H) 08/17/2019   CHOL 222 (H) 01/10/2009   Lab Results  Component Value Date   HDL 32.60 (L) 07/11/2022  HDL 36.00 (L) 08/17/2019   HDL 38.20 (L) 01/10/2009   No results found for: "LDLCALC" Lab Results  Component Value Date   TRIG (H) 07/11/2022    641.0 Triglyceride is over 400; calculations on Lipids are invalid.   TRIG 233.0 (H) 08/17/2019   TRIG 165.0 (H) 01/10/2009   Lab Results  Component Value Date   CHOLHDL 8 07/11/2022   CHOLHDL 6 08/17/2019   CHOLHDL 6 01/10/2009   Lab Results  Component Value Date   LDLDIRECT 103.0 07/11/2022   LDLDIRECT 147.0 08/17/2019   LDLDIRECT 160.2 01/10/2009    Last metabolic panel Lab Results  Component Value Date   GLUCOSE 185 (H) 01/26/2023   NA 133 (L) 01/26/2023   K 4.0 01/26/2023   CL 99 01/26/2023   CO2 23 01/26/2023   BUN 16 01/26/2023   CREATININE 1.07 01/26/2023   GFRNONAA >60 01/26/2023   CALCIUM 9.0 01/26/2023   PROT 7.6 07/11/2022   ALBUMIN 4.5 07/11/2022   BILITOT 0.6 07/11/2022   ALKPHOS 109 07/11/2022   AST 76 (H) 07/11/2022   ALT 154 (H) 07/11/2022   ANIONGAP 11 01/26/2023    Lab Results  Component Value Date   HGBA1C 7.6 (A) 04/09/2023    Last CBC Lab Results  Component Value Date   WBC 7.5 01/26/2023   HGB 13.8 01/26/2023   HCT 41.7 01/26/2023   MCV 86.5 01/26/2023   MCH 28.6 01/26/2023   RDW 13.6 01/26/2023   PLT 194 01/26/2023    Lab Results  Component Value Date   TSH 4.93 (H) 08/17/2019    No results found for: "PSA1", "PSA"  Last vitamin D No results found for: "25OHVITD2", "25OHVITD3",  "VD25OH"  Lab Results  Component Value Date   GLUCOSEUR Positive (A) 07/11/2022   BILIRUBINUR neg 07/11/2022   KETONESU neg 07/11/2022   SPECGRAV 1.025 07/11/2022   RBCUR ned 07/11/2022   PHUR 5.5 07/11/2022   PROTEINUR Positive (A) 07/11/2022   UROBILINOGEN 0.2 07/11/2022   LEUKOCYTESUR Negative 07/11/2022    Lab Results  Component Value Date   MICROALBUR 24.0 (H) 07/11/2022      CT Maxillofacial W Contrast  Result Date: 01/26/2023 CLINICAL DATA:  Earache for the past week. Ear drops are not helping. Concern for malignant otitis externa EXAM: CT MAXILLOFACIAL WITH CONTRAST TECHNIQUE: Multidetector CT imaging of the maxillofacial structures was performed with intravenous contrast. Multiplanar CT image reconstructions were also generated. RADIATION DOSE REDUCTION: This exam was performed according to the departmental dose-optimization program which includes automated exposure control, adjustment of the mA and/or kV according to patient size and/or use of iterative reconstruction technique. CONTRAST:  50mL OMNIPAQUE IOHEXOL 350 MG/ML SOLN COMPARISON:  Head CT 07/14/2022 FINDINGS: Osseous: No external auditory canal erosion to imply malignant otitis externa. No abscess is seen either including around the left external auditory canal soft tissue thickening. Mild left mastoid and middle ear opacification. Symmetric and unremarkable nasopharynx. Orbits: Negative Sinuses: Clear sinuses Soft tissues: As above Limited intracranial: Asymmetric density at the left lateral ventricular body is attributed to choroid and similar to prior head CT. IMPRESSION: Otitis externa on the left without abscess or malignant changes. Mild left otomastoid opacification. Electronically Signed   By: Tiburcio Pea M.D.   On: 01/26/2023 05:22    Recent Results (from the past 2160 hour(s))  CBC     Status: None   Collection Time: 01/26/23  4:13 AM  Result Value Ref Range   WBC 7.5 4.0 - 10.5 K/uL  RBC 4.82 4.22 -  5.81 MIL/uL   Hemoglobin 13.8 13.0 - 17.0 g/dL   HCT 28.4 13.2 - 44.0 %   MCV 86.5 80.0 - 100.0 fL   MCH 28.6 26.0 - 34.0 pg   MCHC 33.1 30.0 - 36.0 g/dL   RDW 10.2 72.5 - 36.6 %   Platelets 194 150 - 400 K/uL   nRBC 0.0 0.0 - 0.2 %    Comment: Performed at [facility], [address], [place], [place] [address]  Basic metabolic panel     Status: Abnormal   Collection Time: 01/26/23  4:13 AM  Result Value Ref Range   Sodium 133 (L) 135 - 145 mmol/L   Potassium 4.0 3.5 - 5.1 mmol/L   Chloride 99 98 - 111 mmol/L   CO2 23 22 - 32 mmol/L   Glucose, Bld 185 (H) 70 - 99 mg/dL    Comment: Glucose reference range applies only to samples taken after fasting for at least 8 hours.   BUN 16 6 - 20 mg/dL   Creatinine, Ser 7.42 0.61 - 1.24 mg/dL   Calcium 9.0 8.9 - 59.5 mg/dL   GFR, Estimated >63 >87 mL/min    Comment: (NOTE) Calculated using the CKD-EPI Creatinine Equation (2021)    Anion gap 11 5 - 15    Comment: Performed at [facility], [address], [place], [place] [address]  POCT glycosylated hemoglobin (Hb A1C)     Status: Abnormal   Collection Time: 04/09/23  8:22 AM  Result Value Ref Range   Hemoglobin A1C 7.6 (A) 4.0 - 5.6 %   HbA1c POC (<> result, manual entry)     HbA1c, POC (prediabetic range)     HbA1c, POC (controlled diabetic range)          Garner Nash, MD, MS

## 2023-04-09 NOTE — Patient Instructions (Signed)
Take fluoxetine 10 mg once daily for 2 weeks, then increase to 20 mg daily. This will help manage your mood symptoms. Start Trulicity 0.75 mg injection once a week to help control your diabetes. Monitor your blood sugar levels regularly and take note of any significant changes or patterns. Schedule a follow-up appointment in 2 weeks for further evaluation and fasting blood work  For urgent psychiatric assistance you can go to   [facility] Phone: [phone]  [address] [place], [place] [address]  Hours: Open 24/7, No appointment required.  [facility]  [address] [place], [place] [address] Phone: [phone]  When you're going through tough times, it's easy to feel lonely and overwhelmed. Remember that YOU ARE NOT ALONE and we at [facility] want to support you during this difficult time.  There are many ways to get help and support when you are ready.  You can call the following help lines: - National suicide hotline (541-880-3397) - National [employer] Network (1-800-SUICIDE)   You can schedule an appointment with a team member with your primary care doc or one of our counselors.  We are all here to support you.  A doctor is on call 24/7   There are many treatments that can help people during difficult times, and we can talk with you about medications, counseling, diet, and other options. We want to offer you HOPE that it won't always feel this bad.   If you are seriously thinking about hurting yourself or have a plan, please call 911 or go to any emergency room right away for immediate help.

## 2023-04-10 DIAGNOSIS — E1165 Type 2 diabetes mellitus with hyperglycemia: Secondary | ICD-10-CM | POA: Insufficient documentation

## 2023-04-10 DIAGNOSIS — F419 Anxiety disorder, unspecified: Secondary | ICD-10-CM | POA: Insufficient documentation

## 2023-04-10 DIAGNOSIS — F322 Major depressive disorder, single episode, severe without psychotic features: Secondary | ICD-10-CM | POA: Insufficient documentation

## 2023-04-10 NOTE — Assessment & Plan Note (Signed)
Patient reports significant depressive symptoms impacting daily functioning.  Plan: Start Fluoxetine 10 mg once daily for 2 weeks, then increase to 20 mg as tolerated. Discuss potential benefits of cognitive behavioral therapy (CBT). Provide information for local mental health resources and crisis hotline.

## 2023-04-10 NOTE — Assessment & Plan Note (Signed)
Patient's diabetes is currently uncontrolled with an A1c of 7.6%; patient has not been taking prescribed medications.  Plan: Restart Trulicity 0.75 mg once weekly. Educate the patient on the importance of medication adherence. Encourage blood glucose monitoring.

## 2023-04-21 ENCOUNTER — Other Ambulatory Visit (HOSPITAL_COMMUNITY): Payer: Self-pay

## 2023-04-27 ENCOUNTER — Telehealth: Payer: Self-pay | Admitting: Pharmacy Technician

## 2023-04-27 ENCOUNTER — Other Ambulatory Visit (HOSPITAL_COMMUNITY): Payer: Self-pay

## 2023-04-27 NOTE — Telephone Encounter (Signed)
Pharmacy Patient Advocate Encounter   Received notification from CoverMyMeds that prior authorization for Trulicity 0.75MG /0.5ML auto-injectors is required/requested.   Insurance verification completed.   The patient is insured through [facility] .   Per test claim: PA required; PA submitted to above mentioned insurance via CoverMyMeds Key/confirmation #/EOC [id number] Status is pending

## 2023-04-27 NOTE — Telephone Encounter (Signed)
Pharmacy Patient Advocate Encounter  Received notification from [facility] that Prior Authorization for Trulicity 0.75MG /0.5ML auto-injectors has been APPROVED from 04/26/2023 to 04/25/2024. Ran test claim, Copay is $0.00. This test claim was processed through [facility]- copay amounts may vary at other pharmacies due to pharmacy/plan contracts, or as the patient moves through the different stages of their insurance plan.   PA #/Case ID/Reference #: [id number]

## 2023-05-01 ENCOUNTER — Other Ambulatory Visit: Payer: Self-pay | Admitting: Family Medicine

## 2023-05-01 DIAGNOSIS — E1165 Type 2 diabetes mellitus with hyperglycemia: Secondary | ICD-10-CM

## 2023-05-01 DIAGNOSIS — F322 Major depressive disorder, single episode, severe without psychotic features: Secondary | ICD-10-CM

## 2023-05-01 DIAGNOSIS — F419 Anxiety disorder, unspecified: Secondary | ICD-10-CM

## 2023-06-24 ENCOUNTER — Other Ambulatory Visit: Payer: Self-pay | Admitting: Family Medicine

## 2023-07-08 ENCOUNTER — Encounter: Payer: Self-pay | Admitting: Family Medicine

## 2023-07-08 ENCOUNTER — Ambulatory Visit (INDEPENDENT_AMBULATORY_CARE_PROVIDER_SITE_OTHER): Payer: 59 | Admitting: Family Medicine

## 2023-07-08 VITALS — BP 136/80 | HR 112 | Temp 97.3°F | Ht 73.0 in | Wt 259.4 lb

## 2023-07-08 DIAGNOSIS — N529 Male erectile dysfunction, unspecified: Secondary | ICD-10-CM | POA: Diagnosis not present

## 2023-07-08 DIAGNOSIS — S39012A Strain of muscle, fascia and tendon of lower back, initial encounter: Secondary | ICD-10-CM | POA: Diagnosis not present

## 2023-07-08 MED ORDER — NAPROXEN 500 MG PO TABS
500.0000 mg | ORAL_TABLET | Freq: Two times a day (BID) | ORAL | 0 refills | Status: DC
Start: 2023-07-08 — End: 2023-09-29

## 2023-07-08 MED ORDER — METHOCARBAMOL 500 MG PO TABS: 500.0000 mg | ORAL_TABLET | Freq: Four times a day (QID) | ORAL | 0 refills | Status: DC

## 2023-07-08 MED ORDER — SILDENAFIL CITRATE 50 MG PO TABS
50.0000 mg | ORAL_TABLET | Freq: Every day | ORAL | 2 refills | Status: AC | PRN
Start: 2023-07-08 — End: ?

## 2023-07-08 NOTE — Assessment & Plan Note (Signed)
 Likely secondary to his diabetes. I will give him a trial of Viagra  50 mg. Reviewed typical side effects, usage, and cautions about nitrate use.
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
## 2023-08-04 ENCOUNTER — Other Ambulatory Visit (HOSPITAL_COMMUNITY): Payer: Self-pay

## 2023-08-04 ENCOUNTER — Telehealth: Payer: Self-pay

## 2023-08-04 NOTE — Telephone Encounter (Signed)
Pharmacy Patient Advocate Encounter   Received notification from CoverMyMeds that prior authorization for Trulicity 0.75MG /0.5ML auto-injectors is required/requested.   Insurance verification completed.   The patient is insured through [facility] .   Per test claim: PA required; PA submitted to above mentioned insurance via CoverMyMeds Key/confirmation #/EOC [id number] Status is pending

## 2023-08-07 ENCOUNTER — Other Ambulatory Visit (HOSPITAL_COMMUNITY): Payer: Self-pay

## 2023-08-07 NOTE — Telephone Encounter (Signed)
Pharmacy Patient Advocate Encounter  Received notification from [facility] that Prior Authorization for Trulicity 0.75MG /0.5ML auto-injectors has been APPROVED from 08/04/23 to 08/03/24. Ran test claim, Copay is $25. This test claim was processed through [facility]- copay amounts may vary at other pharmacies due to pharmacy/plan contracts, or as the patient moves through the different stages of their insurance plan.   PA #/Case ID/Reference #: [id number]

## 2023-09-10 ENCOUNTER — Ambulatory Visit: Admitting: Family Medicine

## 2023-09-16 ENCOUNTER — Ambulatory Visit: Payer: Self-pay | Admitting: Family Medicine

## 2023-09-16 ENCOUNTER — Ambulatory Visit: Payer: Self-pay | Admitting: Adult Health

## 2023-09-16 ENCOUNTER — Telehealth: Payer: Self-pay | Admitting: Family Medicine
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
## 2023-09-17 ENCOUNTER — Ambulatory Visit: Payer: Self-pay | Admitting: Internal Medicine

## 2023-09-17 NOTE — Progress Notes (Deleted)
 [facility] [address] [place] [address] Dept: [phone] Dept Fax: [phone]    Subjective:   Jonathan Vaughn 09-27-1975 09/17/2023  No chief complaint on file.   HPI: Jonathan T Pederson presents today for re-assessment and management of chronic medical conditions.  Discussed the use of AI scribe software for clinical note transcription with the patient, who gave verbal consent to proceed.  History of Present Illness               The following portions of the patient's history were reviewed and updated as appropriate: past medical history, past surgical history, family history, social history, allergies, medications, and problem list.   Patient Active Problem List   Diagnosis Date Noted   Erectile dysfunction 07/08/2023   Anxiety 04/10/2023   Depression, major, single episode, severe (HCC) 04/10/2023   Type 2 diabetes mellitus with hyperglycemia, without long-term current use of insulin (HCC) 04/10/2023   Pneumothorax on left 10/15/2022   Pain in right foot 11/20/2020   Mixed hyperlipidemia 10/25/2019   Incarcerated incisional hernia 11/22/2018   Tobacco use disorder 11/26/2016   Gout 11/26/2016   GERD 03/21/2010   Gastritis and gastroduodenitis 03/21/2010   SHOULDER PAIN 01/23/2010   Lateral epicondylitis 08/03/2009   Headache 01/10/2009   Past Medical History:  Diagnosis Date   Diabetes mellitus without complication (HCC)    False positive QuantiFERON-TB Gold test    GERD 03/21/2010   Qualifier: Diagnosis of  By: Nelson-Smith CMA (AAMA), Dottie     Gout    Headache(784.0) 01/10/2009   Qualifier: Diagnosis of  By: Gabriel Rung LPN, Nancy     Incarcerated incisional hernia 11/22/2018   LATERAL EPICONDYLITIS OF ELBOW 08/03/2009   Qualifier: Diagnosis of  By: Caryl Never MD, Hart Rochester PAIN 01/23/2010   Qualifier: Diagnosis of  By: Gabriel Rung LPN, Harriett Sine     Past Surgical History:   Procedure Laterality Date   CHOLECYSTECTOMY N/A 09/03/2016   Procedure: LAPAROSCOPIC CHOLECYSTECTOMY;  Surgeon: Emelia Loron, MD;  Location: [facility] OR;  Service: General;  Laterality: N/A;   INSERTION OF MESH N/A 11/22/2018   Procedure: Insertion Of Mesh;  Surgeon: Claud Kelp, MD;  Location: [facility] OR;  Service: General;  Laterality: N/A;   TONSILLECTOMY     VENTRAL HERNIA REPAIR N/A 11/22/2018   Procedure: LAPAROSCOPIC REPAIR VENTRAL HERNIA WITH MESH;  Surgeon: Claud Kelp, MD;  Location: [facility] OR;  Service: General;  Laterality: N/A;   Family History  Problem Relation Age of Onset   Crohn's disease Other    Gout Father     Current Outpatient Medications:    acetaminophen (TYLENOL) 500 MG tablet, Take 2 tablets (1,000 mg total) by mouth every 6 (six) hours., Disp: , Rfl:    Continuous Blood Gluc Sensor (FREESTYLE LIBRE 3 SENSOR) MISC, 1 Device by Does not apply route every 14 (fourteen) days. Place 1 sensor on the skin every 14 days. Use to check glucose continuously (Patient not taking: Reported on 07/08/2023), Disp: 2 each, Rfl: 6   Dulaglutide (TRULICITY) 0.75 MG/0.5ML SOAJ, INJECT 0.75 MG AS DIRECTED ONCE A WEEK., Disp: 2 mL, Rfl: 1   FLUoxetine (PROZAC) 10 MG tablet, Take 10 mg by mouth daily., Disp: , Rfl:    ibuprofen (ADVIL) 600 MG tablet, Take 1 tablet (600 mg total) by mouth every 8 (eight) hours as needed., Disp: 20 tablet, Rfl: 0   metFORMIN (GLUCOPHAGE) 500 MG tablet, Take 2 tablets (1,000 mg total) by  mouth 2 (two) times daily with a meal. (Patient not taking: Reported on 07/08/2023), Disp: 180 tablet, Rfl: 1   methocarbamol (ROBAXIN) 500 MG tablet, Take 1 tablet (500 mg total) by mouth 4 (four) times daily., Disp: 20 tablet, Rfl: 0   naproxen (NAPROSYN) 500 MG tablet, Take 1 tablet (500 mg total) by mouth 2 (two) times daily with a meal., Disp: 14 tablet, Rfl: 0   sildenafil (VIAGRA) 50 MG tablet, Take 1 tablet (50 mg total) by mouth daily as needed for erectile dysfunction.,  Disp: 10 tablet, Rfl: 2 Allergies  Allergen Reactions   Lisinopril Anaphylaxis   Reglan [Metoclopramide] Other (See Comments)    Tick, twitching, jittery    Azithromycin Rash     ROS: A complete ROS was performed with pertinent positives/negatives noted in the HPI. The remainder of the ROS are negative.    Objective:   There were no vitals filed for this visit.  GENERAL: Well-appearing, in NAD. Well nourished.  SKIN: Pink, warm and dry. No rash, lesion, ulceration, or ecchymoses.  NECK: Trachea midline. Full ROM w/o pain or tenderness. No lymphadenopathy.  RESPIRATORY: Chest wall symmetrical. Respirations even and non-labored. Breath sounds clear to auscultation bilaterally.  CARDIAC: S1, S2 present, regular rate and rhythm. Peripheral pulses 2+ bilaterally.  MSK: Muscle tone and strength appropriate for age. Joints w/o tenderness, redness, or swelling.  EXTREMITIES: Without clubbing, cyanosis, or edema.  NEUROLOGIC: No motor or sensory deficits. Steady, even gait.  PSYCH/MENTAL STATUS: Alert, oriented x 3. Cooperative, appropriate mood and affect.   Health Maintenance Due  Topic Date Due   Pneumococcal Vaccine 41-24 Years old (1 of 2 - PCV) Never done   FOOT EXAM  Never done   OPHTHALMOLOGY EXAM  Never done   Hepatitis C Screening  Never done   DTaP/Tdap/Td (1 - Tdap) Never done   Colonoscopy  Never done   COVID-19 Vaccine (1 - 2024-25 season) Never done   Diabetic kidney evaluation - Urine ACR  07/12/2023    No results found for any visits on 09/17/23.  The 10-year ASCVD risk score (Arnett DK, et al., 2019) is: 14.1%     Assessment & Plan:  Assessment and Plan              There are no diagnoses linked to this encounter. No orders of the defined types were placed in this encounter.  No images are attached to the encounter or orders placed in the encounter. No orders of the defined types were placed in this encounter.   No follow-ups on file.   Salvatore Decent, FNP

## 2023-09-17 NOTE — Telephone Encounter (Signed)
 Provider aware

## 2023-09-18 NOTE — Telephone Encounter (Signed)
 Pt presented with [employer] insurance. Pt unable to pay out of pocket in order for [place] to see him and declined appointment. Appt cancelled.

## 2023-09-28 ENCOUNTER — Other Ambulatory Visit: Payer: Self-pay | Admitting: Family Medicine

## 2023-09-28 DIAGNOSIS — S39012A Strain of muscle, fascia and tendon of lower back, initial encounter: Secondary | ICD-10-CM

## 2023-09-29 ENCOUNTER — Encounter: Payer: Self-pay | Admitting: Internal Medicine

## 2023-09-29 ENCOUNTER — Ambulatory Visit (INDEPENDENT_AMBULATORY_CARE_PROVIDER_SITE_OTHER): Admitting: Internal Medicine

## 2023-09-29 VITALS — BP 157/97 | HR 73 | Temp 97.7°F | Wt 264.8 lb

## 2023-09-29 DIAGNOSIS — E1165 Type 2 diabetes mellitus with hyperglycemia: Secondary | ICD-10-CM | POA: Diagnosis not present

## 2023-09-29 DIAGNOSIS — E782 Mixed hyperlipidemia: Secondary | ICD-10-CM | POA: Diagnosis not present

## 2023-09-29 DIAGNOSIS — Z7985 Long-term (current) use of injectable non-insulin antidiabetic drugs: Secondary | ICD-10-CM | POA: Diagnosis not present

## 2023-09-29 DIAGNOSIS — I10 Essential (primary) hypertension: Secondary | ICD-10-CM | POA: Insufficient documentation

## 2023-09-29 DIAGNOSIS — E1169 Type 2 diabetes mellitus with other specified complication: Secondary | ICD-10-CM

## 2023-09-29 LAB — POCT GLYCOSYLATED HEMOGLOBIN (HGB A1C): Hemoglobin A1C: 8 % — AB (ref 4.0–5.6)

## 2023-09-29 MED ORDER — ROSUVASTATIN CALCIUM 5 MG PO TABS
5.0000 mg | ORAL_TABLET | Freq: Every day | ORAL | 1 refills | Status: AC
Start: 2023-09-29 — End: ?

## 2023-09-29 MED ORDER — TIRZEPATIDE 2.5 MG/0.5ML ~~LOC~~ SOAJ: 2.5000 mg | SUBCUTANEOUS | 0 refills | Status: DC

## 2023-09-29 MED ORDER — VALSARTAN-HYDROCHLOROTHIAZIDE 160-25 MG PO TABS
1.0000 | ORAL_TABLET | Freq: Every day | ORAL | 1 refills | Status: AC
Start: 2023-09-29 — End: ?

## 2023-09-29 MED ORDER — FREESTYLE LIBRE 3 SENSOR MISC
1.0000 | 6 refills | Status: AC
Start: 2023-09-29 — End: ?

## 2023-09-29 NOTE — Progress Notes (Signed)
 Established Patient Office Visit     CC/Reason for Visit: Follow-up diabetes  HPI: Jonathan Vaughn is a 48 y.o. male who is coming in today for the above mentioned reasons.  This is scheduled as an acute visit.  Patient has yet to establish care with a PCP.  He has been out of diabetic medication for 2 months.   Past Medical/Surgical History: Past Medical History:  Diagnosis Date   Diabetes mellitus without complication (HCC)    False positive QuantiFERON-TB Gold test    GERD 03/21/2010   Qualifier: Diagnosis of  By: Nelson-Smith CMA (AAMA), Dottie     Gout    Headache(784.0) 01/10/2009   Qualifier: Diagnosis of  By: Gabriel Rung LPN, Nancy     Incarcerated incisional hernia 11/22/2018   LATERAL EPICONDYLITIS OF ELBOW 08/03/2009   Qualifier: Diagnosis of  By: Caryl Never MD, Hart Rochester PAIN 01/23/2010   Qualifier: Diagnosis of  By: Gabriel Rung LPN, Harriett Sine      Past Surgical History:  Procedure Laterality Date   CHOLECYSTECTOMY N/A 09/03/2016   Procedure: LAPAROSCOPIC CHOLECYSTECTOMY;  Surgeon: Emelia Loron, MD;  Location: [facility] OR;  Service: General;  Laterality: N/A;   INSERTION OF MESH N/A 11/22/2018   Procedure: Insertion Of Mesh;  Surgeon: Claud Kelp, MD;  Location: [facility] OR;  Service: General;  Laterality: N/A;   TONSILLECTOMY     VENTRAL HERNIA REPAIR N/A 11/22/2018   Procedure: LAPAROSCOPIC REPAIR VENTRAL HERNIA WITH MESH;  Surgeon: Claud Kelp, MD;  Location: [facility] OR;  Service: General;  Laterality: N/A;    Social History:  reports that he quit smoking about 7 years ago. His smoking use included cigarettes. He started smoking about 22 years ago. He has a 45 pack-year smoking history. He has never been exposed to tobacco smoke. He has never used smokeless tobacco. He reports that he does not currently use alcohol. He reports that he does not use drugs.  Allergies: Allergies  Allergen Reactions   Lisinopril Anaphylaxis   Reglan [Metoclopramide] Other (See  Comments)    Tick, twitching, jittery    Azithromycin Rash    Family History:  Family History  Problem Relation Age of Onset   Crohn's disease Other    Gout Father      Current Outpatient Medications:    rosuvastatin (CRESTOR) 5 MG tablet, Take 1 tablet (5 mg total) by mouth daily., Disp: 90 tablet, Rfl: 1   tirzepatide (MOUNJARO) 2.5 MG/0.5ML Pen, Inject 2.5 mg into the skin once a week., Disp: 2 mL, Rfl: 0   valsartan-hydrochlorothiazide (DIOVAN-HCT) 160-25 MG tablet, Take 1 tablet by mouth daily., Disp: 90 tablet, Rfl: 1   acetaminophen (TYLENOL) 500 MG tablet, Take 2 tablets (1,000 mg total) by mouth every 6 (six) hours. (Patient not taking: Reported on 09/29/2023), Disp: , Rfl:    Continuous Glucose Sensor (FREESTYLE LIBRE 3 SENSOR) MISC, 1 Device by Does not apply route every 14 (fourteen) days. Place 1 sensor on the skin every 14 days. Use to check glucose continuously, Disp: 2 each, Rfl: 6   sildenafil (VIAGRA) 50 MG tablet, Take 1 tablet (50 mg total) by mouth daily as needed for erectile dysfunction. (Patient not taking: Reported on 09/29/2023), Disp: 10 tablet, Rfl: 2  Review of Systems:  Negative unless indicated in HPI.   Physical Exam: Vitals:   09/29/23 0917 09/29/23 0922  BP: (!) 140/100 (!) 157/97  Pulse: 73   Temp: 97.7 F (36.5 C)   TempSrc: Oral  SpO2: 98%   Weight: 264 lb 12.8 oz (120.1 kg)     Body mass index is 34.94 kg/m.   Physical Exam Vitals reviewed.  Constitutional:      Appearance: Normal appearance.  HENT:     Head: Normocephalic and atraumatic.  Eyes:     Conjunctiva/sclera: Conjunctivae normal.  Cardiovascular:     Rate and Rhythm: Normal rate and regular rhythm.  Pulmonary:     Effort: Pulmonary effort is normal.     Breath sounds: Normal breath sounds.  Skin:    General: Skin is warm and dry.  Neurological:     General: No focal deficit present.     Mental Status: He is alert and oriented to person, place, and time.   Psychiatric:        Mood and Affect: Mood normal.        Behavior: Behavior normal.        Thought Content: Thought content normal.        Judgment: Judgment normal.      Impression and Plan:  Type 2 diabetes mellitus with hyperglycemia, without long-term current use of insulin (HCC) -     POCT glycosylated hemoglobin (Hb A1C) -     Tirzepatide; Inject 2.5 mg into the skin once a week.  Dispense: 2 mL; Refill: 0  Mixed hyperlipidemia -     Rosuvastatin Calcium; Take 1 tablet (5 mg total) by mouth daily.  Dispense: 90 tablet; Refill: 1  Essential hypertension -     Valsartan-hydroCHLOROthiazide; Take 1 tablet by mouth daily.  Dispense: 90 tablet; Refill: 1  Type 2 diabetes mellitus with other specified complication, without long-term current use of insulin (HCC) -     FreeStyle Libre 3 Sensor; 1 Device by Does not apply route every 14 (fourteen) days. Place 1 sensor on the skin every 14 days. Use to check glucose continuously  Dispense: 2 each; Refill: 6  -He will schedule an appointment to establish care. -A1c is uncontrolled at 8.0.  Start Mounjaro 2.5 mg weekly, continuous glucose monitor prescribed. -Blood pressure is uncontrolled, start Diovan HCT. -Start rosuvastatin 5 mg for presumed hyperlipidemia with diabetes.   Time spent:32 minutes reviewing chart, interviewing and examining patient and formulating plan of care.     Chaya Jan, MD [facility]

## 2023-09-30 ENCOUNTER — Telehealth: Payer: Self-pay

## 2023-09-30 ENCOUNTER — Other Ambulatory Visit (HOSPITAL_COMMUNITY): Payer: Self-pay

## 2023-09-30 ENCOUNTER — Other Ambulatory Visit: Payer: Self-pay

## 2023-09-30 ENCOUNTER — Emergency Department (HOSPITAL_COMMUNITY)
Admission: EM | Admit: 2023-09-30 | Discharge: 2023-09-30 | Discharge status: Against Medical Advice | Attending: Emergency Medicine | Admitting: Emergency Medicine

## 2023-09-30 ENCOUNTER — Ambulatory Visit: Payer: Self-pay

## 2023-09-30 DIAGNOSIS — G43909 Migraine, unspecified, not intractable, without status migrainosus: Secondary | ICD-10-CM | POA: Diagnosis not present

## 2023-09-30 DIAGNOSIS — Z5321 Procedure and treatment not carried out due to patient leaving prior to being seen by health care provider: Secondary | ICD-10-CM | POA: Diagnosis not present

## 2023-09-30 DIAGNOSIS — E119 Type 2 diabetes mellitus without complications: Secondary | ICD-10-CM | POA: Insufficient documentation

## 2023-09-30 LAB — BASIC METABOLIC PANEL WITH GFR
Anion gap: 14 (ref 5–15)
BUN: 17 mg/dL (ref 6–20)
CO2: 23 mmol/L (ref 22–32)
Calcium: 9.8 mg/dL (ref 8.9–10.3)
Chloride: 93 mmol/L — ABNORMAL LOW (ref 98–111)
Creatinine, Ser: 1.02 mg/dL (ref 0.61–1.24)
GFR, Estimated: >60 mL/min (ref >60)
Glucose, Bld: 255 mg/dL — ABNORMAL HIGH (ref 70–99)
Potassium: 4 mmol/L (ref 3.5–5.1)
Sodium: 130 mmol/L — ABNORMAL LOW (ref 135–145)

## 2023-09-30 LAB — URINALYSIS, ROUTINE W REFLEX MICROSCOPIC
Bacteria, UA: NONE SEEN
Bilirubin Urine: NEGATIVE
Glucose, UA: 50 mg/dL — AB
Hgb urine dipstick: NEGATIVE
Ketones, ur: NEGATIVE mg/dL
Leukocytes,Ua: NEGATIVE
Nitrite: NEGATIVE
Protein, ur: 100 mg/dL — AB
Specific Gravity, Urine: 1.021 (ref 1.005–1.030)
pH: 5 (ref 5.0–8.0)

## 2023-09-30 LAB — I-STAT CHEM 8, ED
BUN: 20 mg/dL (ref 6–20)
Calcium, Ion: 1.14 mmol/L — ABNORMAL LOW (ref 1.15–1.40)
Chloride: 97 mmol/L — ABNORMAL LOW (ref 98–111)
Creatinine, Ser: 1.1 mg/dL (ref 0.61–1.24)
Glucose, Bld: 258 mg/dL — ABNORMAL HIGH (ref 70–99)
HCT: 54 % — ABNORMAL HIGH (ref 39.0–52.0)
Hemoglobin: 18.4 g/dL — ABNORMAL HIGH (ref 13.0–17.0)
Potassium: 4.3 mmol/L (ref 3.5–5.1)
Sodium: 132 mmol/L — ABNORMAL LOW (ref 135–145)
TCO2: 25 mmol/L (ref 22–32)

## 2023-09-30 LAB — CBC
HCT: 50.6 % (ref 39.0–52.0)
Hemoglobin: 17.6 g/dL — ABNORMAL HIGH (ref 13.0–17.0)
MCH: 29.7 pg (ref 26.0–34.0)
MCHC: 34.8 g/dL (ref 30.0–36.0)
MCV: 85.3 fL (ref 80.0–100.0)
Platelets: 296 10*3/uL (ref 150–400)
RBC: 5.93 MIL/uL — ABNORMAL HIGH (ref 4.22–5.81)
RDW: 13.4 % (ref 11.5–15.5)
WBC: 9.4 10*3/uL (ref 4.0–10.5)
nRBC: 0 % (ref 0.0–0.2)

## 2023-09-30 LAB — CBG MONITORING, ED: Glucose-Capillary: 262 mg/dL — ABNORMAL HIGH (ref 70–99)

## 2023-09-30 NOTE — Telephone Encounter (Signed)
 Copied from CRM [id number]. Topic: Clinical - Prescription Issue >> Sep 30, 2023 10:21 AM Alcus Dad wrote: Reason for CRM: Patient needs prior authorization for medication tirzepatide [facility]) 2.5 MG/0.5ML Pen. [id number] [place] [place] - [address] [facility] [address] [place] [address] Phone: [phone] Fax: [phone]

## 2023-09-30 NOTE — Telephone Encounter (Signed)
 Pharmacy Patient Advocate Encounter   Received notification from Pt Calls Messages that prior authorization for [facility] 2.5MG /0.5ML auto-injectors is required/requested.   Insurance verification completed.   The patient is insured through [facility] .   Per test claim: PA required; PA submitted to above mentioned insurance via CoverMyMeds Key/confirmation #/EOC [id number] Status is pending

## 2023-09-30 NOTE — ED Triage Notes (Addendum)
 Pt. Stated, Jonathan Vaughn been out of my diabetic medication for 2 months due to insurance and the availability. For 2 days Ive had the worst migraine headache for 2 days, blurred vision, sweating. Im just not feeling bad. . I went to see Dr. Corinda Gubler yesterday and they gave me some new BP medicine and cholesterol medicine, but they only pricked my finger.

## 2023-09-30 NOTE — Telephone Encounter (Signed)
 PA request has been Submitted. New Encounter has been or will be created for follow up. For additional info see Pharmacy Prior Auth telephone encounter from 09/30/23.

## 2023-09-30 NOTE — Telephone Encounter (Signed)
 Chief Complaint: Elevated Blood pressure  Symptoms: Headache, blurred vision, dizzy, irritable, sweats  Frequency: Onset yesterday  Pertinent Negatives: Patient denies fever, nausea, vomiting, weakness Disposition: [x] ED /[] Urgent Care (no appt availability in office) / [] Appointment(In office/virtual)/ []  [place] [facility]/ [] Home Care/ [] Refused Recommended Disposition /[] [facility]/ []  Follow-up with PCP Additional Notes: Patient states he started  Diovan-HCT yesterday but diastolic pressure is still elevated. While on the call patient checked blood pressure x 2 BP-114/88 and 120/83 patient was advised those are safe numbers although slightly elevated. Patient states that the machine is wrong and his blood pressure is high. [facility] also states [facility] not [facility] the Mounjaro in stock and his blood sugar was elevated yesterday. Care advice was given and ED was advised based off patient report of symptoms. Patient was yelling at his partner and being very rude towards his partner who was trying to help. Wife requested something else be sent to the pharmacy for blood sugar control since the [facility] is not available. Please advise  Copied from CRM [id number]. Topic: Clinical - Red Word Triage >> Sep 30, 2023  8:56 AM Elle L wrote: Red Word that prompted transfer to Nurse Triage: The patient was prescribed high blood pressure medication at his appointment yesterday but his blood pressure is still high. The pharmacy also did not have his diabetes medication and his blood sugar is still high. The patient has not had his diabetes medication in over two months. His blood sugar is 235. He is having dizzy spells and cramps in his legs and states he does not feel right. Reason for Disposition  [1] Systolic BP  >= 160 OR Diastolic >= 100 AND [2] cardiac (e.g., breathing difficulty, chest pain) or neurologic symptoms (e.g., new-onset blurred or double vision, unsteady gait)  Answer  Assessment - Initial Assessment Questions 1. BLOOD PRESSURE: "What is the blood pressure?" "Did you take at least two measurements 5 minutes apart?"     114/88, 120/83, HR103 2. ONSET: "When did you take your blood pressure?"     Right now  3. HOW: "How did you take your blood pressure?" (e.g., automatic home BP monitor, visiting nurse)     Automatic home BP monitor  4. HISTORY: "Do you have a history of high blood pressure?"     Yes  5. MEDICINES: "Are you taking any medicines for blood pressure?" "Have you missed any doses recently?"     Just started yesterday and it's not helping  6. OTHER SYMPTOMS: "Do you have any symptoms?" (e.g., blurred vision, chest pain, difficulty breathing, headache, weakness)     Headache, blurred vision, dizzy, leg cramps  Protocols used: Blood Pressure - High-A-AH

## 2023-09-30 NOTE — Telephone Encounter (Signed)
 Patient currently at the ED.

## 2023-09-30 NOTE — ED Notes (Signed)
 Patient decided to leave he stated damn this place and walked out

## 2023-10-01 ENCOUNTER — Other Ambulatory Visit: Payer: Self-pay | Admitting: Internal Medicine

## 2023-10-01 DIAGNOSIS — E1165 Type 2 diabetes mellitus with hyperglycemia: Secondary | ICD-10-CM

## 2023-10-01 MED ORDER — TRULICITY 0.75 MG/0.5ML ~~LOC~~ SOAJ: 0.7500 mg | SUBCUTANEOUS | 0 refills | Status: DC

## 2023-10-01 NOTE — Telephone Encounter (Signed)
 Pharmacy Patient Advocate Encounter  Received notification from [facility] that Prior Authorization for Mounjaro 2.5mg /0.34ml has been DENIED.  Full denial letter will be uploaded to the media tab. See denial reason below.   PA #/Case ID/Reference #: [id number]

## 2023-10-01 NOTE — Telephone Encounter (Signed)
 Patient is aware

## 2023-10-28 ENCOUNTER — Other Ambulatory Visit: Payer: Self-pay | Admitting: Internal Medicine

## 2023-10-28 DIAGNOSIS — E1165 Type 2 diabetes mellitus with hyperglycemia: Secondary | ICD-10-CM

## 2023-11-04 ENCOUNTER — Telehealth: Payer: Self-pay | Admitting: Family Medicine

## 2023-11-04 ENCOUNTER — Ambulatory Visit: Admitting: Family Medicine

## 2023-11-04 ENCOUNTER — Encounter: Payer: Self-pay | Admitting: General Practice

## 2023-11-04 DIAGNOSIS — E1165 Type 2 diabetes mellitus with hyperglycemia: Secondary | ICD-10-CM | POA: Diagnosis not present

## 2023-11-04 DIAGNOSIS — I10 Essential (primary) hypertension: Secondary | ICD-10-CM | POA: Diagnosis not present

## 2023-11-04 DIAGNOSIS — M25571 Pain in right ankle and joints of right foot: Secondary | ICD-10-CM | POA: Diagnosis not present

## 2023-11-04 DIAGNOSIS — Z8739 Personal history of other diseases of the musculoskeletal system and connective tissue: Secondary | ICD-10-CM | POA: Diagnosis not present

## 2023-11-04 DIAGNOSIS — E782 Mixed hyperlipidemia: Secondary | ICD-10-CM | POA: Diagnosis not present

## 2023-11-04 NOTE — Telephone Encounter (Signed)
 I spoke with patient Jonathan Vaughn regarding Dr Arliss Lam not being able to see him today and he would need to go back to his primary care office to be seen before his TOC with [name], pt stated we are jokers and cussed at me using the "F" word, I asked patient not to cuss and he said the "F" word once again, call was ended.

## 2023-11-18 DIAGNOSIS — E1165 Type 2 diabetes mellitus with hyperglycemia: Secondary | ICD-10-CM | POA: Diagnosis not present

## 2023-11-18 DIAGNOSIS — I10 Essential (primary) hypertension: Secondary | ICD-10-CM | POA: Diagnosis not present

## 2023-11-18 DIAGNOSIS — E782 Mixed hyperlipidemia: Secondary | ICD-10-CM | POA: Diagnosis not present

## 2023-11-27 DIAGNOSIS — B028 Zoster with other complications: Secondary | ICD-10-CM | POA: Diagnosis not present

## 2023-11-27 DIAGNOSIS — R21 Rash and other nonspecific skin eruption: Secondary | ICD-10-CM | POA: Diagnosis not present

## 2023-11-27 DIAGNOSIS — Z8739 Personal history of other diseases of the musculoskeletal system and connective tissue: Secondary | ICD-10-CM | POA: Diagnosis not present

## 2023-11-30 ENCOUNTER — Encounter: Admitting: Family Medicine

## 2024-01-06 DIAGNOSIS — E782 Mixed hyperlipidemia: Secondary | ICD-10-CM | POA: Diagnosis not present

## 2024-01-06 DIAGNOSIS — M10071 Idiopathic gout, right ankle and foot: Secondary | ICD-10-CM | POA: Diagnosis not present

## 2024-01-06 DIAGNOSIS — I10 Essential (primary) hypertension: Secondary | ICD-10-CM | POA: Diagnosis not present

## 2024-01-06 DIAGNOSIS — H6692 Otitis media, unspecified, left ear: Secondary | ICD-10-CM | POA: Diagnosis not present

## 2024-01-06 DIAGNOSIS — E1165 Type 2 diabetes mellitus with hyperglycemia: Secondary | ICD-10-CM | POA: Diagnosis not present

## 2024-01-06 DIAGNOSIS — Z125 Encounter for screening for malignant neoplasm of prostate: Secondary | ICD-10-CM | POA: Diagnosis not present

## 2024-01-06 DIAGNOSIS — Z0000 Encounter for general adult medical examination without abnormal findings: Secondary | ICD-10-CM | POA: Diagnosis not present

## 2024-04-04 DIAGNOSIS — Z125 Encounter for screening for malignant neoplasm of prostate: Secondary | ICD-10-CM | POA: Diagnosis not present

## 2024-04-04 DIAGNOSIS — Z0000 Encounter for general adult medical examination without abnormal findings: Secondary | ICD-10-CM | POA: Diagnosis not present

## 2024-04-04 DIAGNOSIS — E1165 Type 2 diabetes mellitus with hyperglycemia: Secondary | ICD-10-CM | POA: Diagnosis not present

## 2024-04-07 DIAGNOSIS — E782 Mixed hyperlipidemia: Secondary | ICD-10-CM | POA: Diagnosis not present

## 2024-04-07 DIAGNOSIS — M10071 Idiopathic gout, right ankle and foot: Secondary | ICD-10-CM | POA: Diagnosis not present

## 2024-04-07 DIAGNOSIS — Z1211 Encounter for screening for malignant neoplasm of colon: Secondary | ICD-10-CM | POA: Diagnosis not present

## 2024-04-07 DIAGNOSIS — I10 Essential (primary) hypertension: Secondary | ICD-10-CM | POA: Diagnosis not present

## 2024-04-07 DIAGNOSIS — F331 Major depressive disorder, recurrent, moderate: Secondary | ICD-10-CM | POA: Diagnosis not present

## 2024-04-07 DIAGNOSIS — Z0000 Encounter for general adult medical examination without abnormal findings: Secondary | ICD-10-CM | POA: Diagnosis not present

## 2024-04-07 DIAGNOSIS — E1165 Type 2 diabetes mellitus with hyperglycemia: Secondary | ICD-10-CM | POA: Diagnosis not present

## 2024-05-13 ENCOUNTER — Emergency Department (HOSPITAL_COMMUNITY)
Admission: EM | Admit: 2024-05-13 | Discharge: 2024-05-13 | Disposition: A | Discharge status: Home / Self Care | Attending: Emergency Medicine | Admitting: Emergency Medicine

## 2024-05-13 ENCOUNTER — Emergency Department (HOSPITAL_COMMUNITY)

## 2024-05-13 DIAGNOSIS — M25572 Pain in left ankle and joints of left foot: Secondary | ICD-10-CM | POA: Diagnosis not present

## 2024-05-13 DIAGNOSIS — M7732 Calcaneal spur, left foot: Secondary | ICD-10-CM | POA: Diagnosis not present

## 2024-05-13 DIAGNOSIS — S92515A Nondisplaced fracture of proximal phalanx of left lesser toe(s), initial encounter for closed fracture: Secondary | ICD-10-CM | POA: Diagnosis not present

## 2024-05-13 DIAGNOSIS — E119 Type 2 diabetes mellitus without complications: Secondary | ICD-10-CM | POA: Insufficient documentation

## 2024-05-13 DIAGNOSIS — W1830XA Fall on same level, unspecified, initial encounter: Secondary | ICD-10-CM | POA: Diagnosis not present

## 2024-05-13 DIAGNOSIS — S92512A Displaced fracture of proximal phalanx of left lesser toe(s), initial encounter for closed fracture: Secondary | ICD-10-CM | POA: Diagnosis not present

## 2024-05-13 DIAGNOSIS — S99922A Unspecified injury of left foot, initial encounter: Secondary | ICD-10-CM | POA: Diagnosis not present

## 2024-05-13 MED ORDER — KETOROLAC TROMETHAMINE 15 MG/ML IJ SOLN
15.0000 mg | Freq: Once | INTRAMUSCULAR | Status: AC
  Administered 2024-05-13: 15 mg via INTRAMUSCULAR
  Filled 2024-05-13: qty 1

## 2024-05-13 MED ORDER — OXYCODONE HCL 5 MG PO TABS
5.0000 mg | ORAL_TABLET | Freq: Once | ORAL | Status: AC
  Administered 2024-05-13: 5 mg via ORAL
  Filled 2024-05-13: qty 1

## 2024-05-13 MED ORDER — OXYCODONE HCL 5 MG PO TABS: 5.0000 mg | ORAL_TABLET | ORAL | 0 refills | Status: AC | PRN

## 2024-05-13 NOTE — Discharge Instructions (Addendum)
 You were evaluated in the emergency room for toe pain.  You were found to have a fracture of your fourth digit.  I recommend continue to buddy tape daily.  You may use the postop shoe while ambulating as well.  Please call the number on the sheet to follow-up with orthopedics.  I would alternate Tylenol  and ibuprofen  throughout the day and apply ice regularly.  You may use oxycodone  as been sent into your pharmacy for breakthrough pain.  Please avoid driving or drinking alcohol while using this medication as it may cause drowsiness.

## 2024-05-13 NOTE — ED Triage Notes (Signed)
 Pt comes in for left foot pain. Pt had a fall last week and bruised the top of his foot. Pt is unable to bear weight. A&Ox4. PMS intact. Toes are slightly swollen on left foot

## 2024-05-13 NOTE — ED Provider Notes (Signed)
 [place] EMERGENCY DEPARTMENT AT [facility] Provider Note   CSN: [id number] Arrival date & time: 05/13/24  1101     Patient presents with: Fall (Left foot pain)   Jonathan Vaughn is a 48 y.o. male with history of gout presents with complaints of left foot pain.  Patient states that he had a fall last week.  Had intermittent pain in his foot.  Had ecchymosis at that time.  Reports his symptoms have worsened and now he has difficulty even ambulating.  No numbness or tingling.  No prior surgeries to the extremity.  Pain is worse with movement.    Fall      Past Medical History:  Diagnosis Date   Diabetes mellitus without complication (HCC)    False positive QuantiFERON-TB Gold test    GERD 03/21/2010   Qualifier: Diagnosis of  By: Nelson-Smith CMA (AAMA), Dottie     Gout    Headache(784.0) 01/10/2009   Qualifier: Diagnosis of  By: Lavinia LPN, Nancy     Incarcerated incisional hernia 11/22/2018   LATERAL EPICONDYLITIS OF ELBOW 08/03/2009   Qualifier: Diagnosis of  By: Micheal MD, Wolm ROYS PAIN 01/23/2010   Qualifier: Diagnosis of  By: Lavinia LPN, Inocente     Past Surgical History:  Procedure Laterality Date   CHOLECYSTECTOMY N/A 09/03/2016   Procedure: LAPAROSCOPIC CHOLECYSTECTOMY;  Surgeon: Donnice Bury, MD;  Location: [facility] OR;  Service: General;  Laterality: N/A;   INSERTION OF MESH N/A 11/22/2018   Procedure: Insertion Of Mesh;  Surgeon: Gail Favorite, MD;  Location: [facility] OR;  Service: General;  Laterality: N/A;   TONSILLECTOMY     VENTRAL HERNIA REPAIR N/A 11/22/2018   Procedure: LAPAROSCOPIC REPAIR VENTRAL HERNIA WITH MESH;  Surgeon: Gail Favorite, MD;  Location: [facility] OR;  Service: General;  Laterality: N/A;     Prior to Admission medications   Medication Sig Start Date End Date Taking? Authorizing Provider  oxyCODONE  (ROXICODONE ) 5 MG immediate release tablet Take 1 tablet (5 mg total) by mouth every 4 (four) hours as needed for severe pain  (pain score 7-10). 05/13/24  Yes Donnajean Lynwood DEL, PA-C  acetaminophen  (TYLENOL ) 500 MG tablet Take 2 tablets (1,000 mg total) by mouth every 6 (six) hours. Patient not taking: Reported on 09/29/2023 10/16/22   Tammy Sor, PA-C  Continuous Glucose Sensor (FREESTYLE LIBRE 3 SENSOR) MISC 1 Device by Does not apply route every 14 (fourteen) days. Place 1 sensor on the skin every 14 days. Use to check glucose continuously 09/29/23   Theophilus Andrews, Tully GRADE, MD  Dulaglutide  (TRULICITY ) 0.75 MG/0.5ML SOAJ INJECT 0.75 MG SUBCUTANEOUSLY ONE TIME PER WEEK 10/28/23   Theophilus Andrews, Tully GRADE, MD  rosuvastatin  (CRESTOR ) 5 MG tablet Take 1 tablet (5 mg total) by mouth daily. 09/29/23   Theophilus Andrews, Tully GRADE, MD  sildenafil  (VIAGRA ) 50 MG tablet Take 1 tablet (50 mg total) by mouth daily as needed for erectile dysfunction. Patient not taking: Reported on 09/29/2023 07/08/23   Thedora Garnette HERO, MD  valsartan -hydrochlorothiazide  (DIOVAN -HCT) 160-25 MG tablet Take 1 tablet by mouth daily. 09/29/23   Theophilus Andrews, Tully GRADE, MD    Allergies: Lisinopril , Reglan  [metoclopramide ], and Azithromycin    Review of Systems  Musculoskeletal:  Positive for arthralgias.    Updated Vital Signs BP (!) 154/95   Pulse 91   Temp 97.9 F (36.6 C) (Oral)   Resp 19   Ht 6' 1 (1.854 m)   Wt 113.4 kg  SpO2 97%   BMI 32.98 kg/m   Physical Exam Vitals and nursing note reviewed.  Constitutional:      General: He is not in acute distress.    Appearance: He is well-developed.  HENT:     Head: Normocephalic and atraumatic.  Eyes:     Conjunctiva/sclera: Conjunctivae normal.  Cardiovascular:     Rate and Rhythm: Normal rate.     Pulses: Normal pulses.  Pulmonary:     Effort: Pulmonary effort is normal. No respiratory distress.  Musculoskeletal:     Cervical back: Neck supple.     Comments: Generalized tenderness to the dorsum of the left midfoot, trace edema into the digits.  Tolerates full range of motion of  ankle and digits.  No significant ecchymosis, compartments are soft, DP/PT pulses are 2+  Skin:    General: Skin is warm and dry.     Capillary Refill: Capillary refill takes less than 2 seconds.  Neurological:     Mental Status: He is alert.  Psychiatric:        Mood and Affect: Mood normal.     (all labs ordered are listed, but only abnormal results are displayed) Labs Reviewed - No data to display  EKG: None  Radiology: DG Foot Complete Left Result Date: 05/13/2024 EXAM: 3 OR MORE VIEW(S) XRAY OF THE LEFT FOOT 05/13/2024 11:33:00 AM COMPARISON: Comparison left ankle series 05/13/2024. CLINICAL HISTORY: 48 year old male with pain. FINDINGS: BONES AND JOINTS: Oblique extra-articular fracture of the fourth proximal phalanx. Small superior and inferior calcaneal spurs noted. No joint dislocation. SOFT TISSUES: Soft tissue swelling of the fourth digit. IMPRESSION: 1. Oblique extra-articular fracture of the left fourth proximal phalanx. Electronically signed by: Helayne Hurst MD 05/13/2024 12:01 PM EST RP Workstation: [id number]   DG Ankle Complete Left Result Date: 05/13/2024 EXAM: 3 VIEW(S) XRAY OF THE LEFT ANKLE 05/13/2024 11:33:00 AM CLINICAL HISTORY: 48 year old male. Pain. COMPARISON: None available. FINDINGS: BONES AND JOINTS: Well corticated ossific density adjacent to the tip of the lateral malleolus consistent with sequela of remote trauma. Small superior and inferior calcaneal spurs. No acute fracture. No joint dislocation. SOFT TISSUES: The soft tissues are unremarkable. IMPRESSION: 1. No acute fracture or dislocation identified about the right ankle. 2. Well-corticated ossific density adjacent to the lateral malleolus tip, consistent with remote trauma sequela. Electronically signed by: Helayne Hurst MD 05/13/2024 12:00 PM EST RP Workstation: [id number]     Procedures   Medications Ordered in the ED  ketorolac  (TORADOL ) 15 MG/ML injection 15 mg (15 mg Intramuscular Given 05/13/24  1124)  oxyCODONE  (Oxy IR/ROXICODONE ) immediate release tablet 5 mg (5 mg Oral Given 05/13/24 1227)    Clinical Course as of 05/13/24 1234  Fri May 13, 2024  1121 Patient evaluated for complaints of left foot injury that occurred last week that has since worsened.  Upon arrival he is hemodynamically stable.  On exam he has tenderness to the dorsum of the midfoot, trace amount of edema to his digits.  No significant ecchymosis.  Tolerates full range of motion of his foot and ankle.  Is neurovascular intact.  Will obtain x-rays for further evaluation. [JT]  1205 X-rays consistent with oblique extra-articular fracture of the left fourth proximal phalanx [JT]  1208 Patient will be instructed on buddy taping.  Will additionally provide postop shoe.  Will send Ortho referral.  Instructed on RICE protocol.  Patient understanding agreement plan. [JT]    Clinical Course User Index [JT] Donnajean Lynwood DEL, PA-C  Medical Decision Making  This patient presents to the ED with chief complaint(s) of foot injury .  The complaint involves an extensive differential diagnosis and also carries with it a high risk of complications and morbidity.   Pertinent past medical history as listed in HPI  The differential diagnosis includes  Fracture, dislocation, sprain, septic joint, gout Additional history obtained: Records reviewed Care Everywhere/External Records  Disposition:   Patient will be discharged home. The patient has been appropriately medically screened and/or stabilized in the ED. I have low suspicion for any other emergent medical condition which would require further screening, evaluation or treatment in the ED or require inpatient management. At time of discharge the patient is hemodynamically stable and in no acute distress. I have discussed work-up results and diagnosis with patient and answered all questions. Patient is agreeable with discharge plan. We discussed strict  return precautions for returning to the emergency department and they verbalized understanding.     Social Determinants of Health:   Patient's impaired access to primary care  increases the complexity of managing their presentation  This note was dictated with voice recognition software.  Despite best efforts at proofreading, errors may have occurred which can change the documentation meaning.       Final diagnoses:  Closed nondisplaced fracture of proximal phalanx of lesser toe of left foot, initial encounter    ED Discharge Orders          Ordered    oxyCODONE  (ROXICODONE ) 5 MG immediate release tablet  Every 4 hours PRN        05/13/24 1232               Donnajean Lynwood DEL, PA-C 05/13/24 1234    Haviland, Julie, MD 05/13/24 1436
